# Patient Record
Sex: Male | Born: 1975 | Race: White | Hispanic: No | Marital: Married | Smoking: Former smoker
Health system: Southern US, Community
[De-identification: ages and names within clinical notes are randomized; demographics above are authoritative.]

## PROBLEM LIST (undated history)

## (undated) ENCOUNTER — Emergency Department (HOSPITAL_BASED_OUTPATIENT_CLINIC_OR_DEPARTMENT_OTHER): Discharge status: Absent without leave | Payer: No Typology Code available for payment source

## (undated) DIAGNOSIS — F32A Depression, unspecified: Secondary | ICD-10-CM

## (undated) DIAGNOSIS — N200 Calculus of kidney: Secondary | ICD-10-CM

## (undated) DIAGNOSIS — F329 Major depressive disorder, single episode, unspecified: Secondary | ICD-10-CM

## (undated) HISTORY — PX: APPENDECTOMY: SHX54

---

## 2008-03-26 DIAGNOSIS — M545 Low back pain, unspecified: Secondary | ICD-10-CM | POA: Insufficient documentation

## 2010-10-19 ENCOUNTER — Emergency Department (HOSPITAL_BASED_OUTPATIENT_CLINIC_OR_DEPARTMENT_OTHER)
Admission: EM | Admit: 2010-10-19 | Discharge: 2010-10-19 | Disposition: A | Discharge status: Home / Self Care | Payer: BC Managed Care – PPO | Attending: Emergency Medicine | Admitting: Emergency Medicine

## 2010-10-19 ENCOUNTER — Emergency Department (INDEPENDENT_AMBULATORY_CARE_PROVIDER_SITE_OTHER): Payer: BC Managed Care – PPO

## 2010-10-19 DIAGNOSIS — R110 Nausea: Secondary | ICD-10-CM | POA: Insufficient documentation

## 2010-10-19 DIAGNOSIS — N201 Calculus of ureter: Secondary | ICD-10-CM

## 2010-10-19 DIAGNOSIS — R109 Unspecified abdominal pain: Secondary | ICD-10-CM | POA: Insufficient documentation

## 2010-10-19 DIAGNOSIS — N1330 Unspecified hydronephrosis: Secondary | ICD-10-CM

## 2010-10-19 DIAGNOSIS — F411 Generalized anxiety disorder: Secondary | ICD-10-CM | POA: Insufficient documentation

## 2010-10-19 DIAGNOSIS — IMO0002 Reserved for concepts with insufficient information to code with codable children: Secondary | ICD-10-CM | POA: Insufficient documentation

## 2010-10-19 LAB — URINE MICROSCOPIC-ADD ON

## 2010-10-19 LAB — URINALYSIS, ROUTINE W REFLEX MICROSCOPIC
Nitrite: NEGATIVE
Protein, ur: NEGATIVE mg/dL
Urobilinogen, UA: 0.2 mg/dL (ref 0.0–1.0)

## 2010-10-20 LAB — URINE CULTURE
Colony Count: NO GROWTH
Culture  Setup Time: 201203051021
Culture: NO GROWTH

## 2010-11-17 ENCOUNTER — Emergency Department (HOSPITAL_BASED_OUTPATIENT_CLINIC_OR_DEPARTMENT_OTHER)
Admission: EM | Admit: 2010-11-17 | Discharge: 2010-11-17 | Disposition: A | Discharge status: Home / Self Care | Payer: BC Managed Care – PPO | Attending: Emergency Medicine | Admitting: Emergency Medicine

## 2010-11-17 DIAGNOSIS — F17200 Nicotine dependence, unspecified, uncomplicated: Secondary | ICD-10-CM | POA: Insufficient documentation

## 2010-11-17 DIAGNOSIS — N200 Calculus of kidney: Secondary | ICD-10-CM | POA: Insufficient documentation

## 2010-11-17 DIAGNOSIS — R109 Unspecified abdominal pain: Secondary | ICD-10-CM | POA: Insufficient documentation

## 2010-11-17 DIAGNOSIS — G8929 Other chronic pain: Secondary | ICD-10-CM | POA: Insufficient documentation

## 2010-11-17 LAB — DIFFERENTIAL
Eosinophils Absolute: 0.2 10*3/uL (ref 0.0–0.7)
Eosinophils Relative: 2 % (ref 0–5)
Lymphs Abs: 1.8 10*3/uL (ref 0.7–4.0)
Monocytes Relative: 9 % (ref 3–12)

## 2010-11-17 LAB — CBC
MCH: 30.3 pg (ref 26.0–34.0)
MCHC: 35.2 g/dL (ref 30.0–36.0)
MCV: 86.1 fL (ref 78.0–100.0)
Platelets: 444 10*3/uL — ABNORMAL HIGH (ref 150–400)
RDW: 12 % (ref 11.5–15.5)

## 2010-11-17 LAB — BASIC METABOLIC PANEL
BUN: 17 mg/dL (ref 6–23)
Calcium: 9.7 mg/dL (ref 8.4–10.5)
Creatinine, Ser: 0.8 mg/dL (ref 0.4–1.5)
GFR calc non Af Amer: >60 mL/min (ref >60)

## 2010-11-17 LAB — URINALYSIS, ROUTINE W REFLEX MICROSCOPIC
Glucose, UA: NEGATIVE mg/dL
Hgb urine dipstick: NEGATIVE
Ketones, ur: NEGATIVE mg/dL
Protein, ur: NEGATIVE mg/dL

## 2010-12-12 ENCOUNTER — Emergency Department (HOSPITAL_BASED_OUTPATIENT_CLINIC_OR_DEPARTMENT_OTHER)
Admission: EM | Admit: 2010-12-12 | Discharge: 2010-12-12 | Disposition: A | Discharge status: Home / Self Care | Payer: BC Managed Care – PPO | Attending: Emergency Medicine | Admitting: Emergency Medicine

## 2010-12-12 DIAGNOSIS — G8929 Other chronic pain: Secondary | ICD-10-CM | POA: Insufficient documentation

## 2010-12-12 DIAGNOSIS — N419 Inflammatory disease of prostate, unspecified: Secondary | ICD-10-CM | POA: Insufficient documentation

## 2010-12-12 DIAGNOSIS — R300 Dysuria: Secondary | ICD-10-CM | POA: Insufficient documentation

## 2010-12-12 DIAGNOSIS — F17200 Nicotine dependence, unspecified, uncomplicated: Secondary | ICD-10-CM | POA: Insufficient documentation

## 2010-12-12 LAB — URINALYSIS, ROUTINE W REFLEX MICROSCOPIC
Glucose, UA: NEGATIVE mg/dL
Ketones, ur: NEGATIVE mg/dL
Nitrite: NEGATIVE
Protein, ur: NEGATIVE mg/dL

## 2011-02-10 ENCOUNTER — Emergency Department (HOSPITAL_BASED_OUTPATIENT_CLINIC_OR_DEPARTMENT_OTHER)
Admission: EM | Admit: 2011-02-10 | Discharge: 2011-02-10 | Disposition: A | Discharge status: Home / Self Care | Payer: BC Managed Care – PPO | Attending: Emergency Medicine | Admitting: Emergency Medicine

## 2011-02-10 ENCOUNTER — Emergency Department (INDEPENDENT_AMBULATORY_CARE_PROVIDER_SITE_OTHER): Payer: BC Managed Care – PPO

## 2011-02-10 DIAGNOSIS — G8929 Other chronic pain: Secondary | ICD-10-CM | POA: Insufficient documentation

## 2011-02-10 DIAGNOSIS — R109 Unspecified abdominal pain: Secondary | ICD-10-CM | POA: Insufficient documentation

## 2011-02-10 DIAGNOSIS — N200 Calculus of kidney: Secondary | ICD-10-CM | POA: Insufficient documentation

## 2011-02-10 LAB — URINALYSIS, ROUTINE W REFLEX MICROSCOPIC
Bilirubin Urine: NEGATIVE
Ketones, ur: NEGATIVE mg/dL
Nitrite: NEGATIVE
pH: 7 (ref 5.0–8.0)

## 2011-12-03 ENCOUNTER — Encounter (HOSPITAL_BASED_OUTPATIENT_CLINIC_OR_DEPARTMENT_OTHER): Payer: Self-pay | Admitting: *Deleted

## 2011-12-03 ENCOUNTER — Ambulatory Visit: Payer: BC Managed Care – PPO

## 2011-12-03 ENCOUNTER — Ambulatory Visit (INDEPENDENT_AMBULATORY_CARE_PROVIDER_SITE_OTHER): Payer: BC Managed Care – PPO | Admitting: Physician Assistant

## 2011-12-03 ENCOUNTER — Emergency Department (HOSPITAL_BASED_OUTPATIENT_CLINIC_OR_DEPARTMENT_OTHER)
Admission: EM | Admit: 2011-12-03 | Discharge: 2011-12-03 | Disposition: A | Discharge status: Home / Self Care | Payer: BC Managed Care – PPO | Attending: Emergency Medicine | Admitting: Emergency Medicine

## 2011-12-03 VITALS — BP 117/72 | HR 62 | Temp 98.2°F | Resp 18 | Ht 70.0 in | Wt 166.0 lb

## 2011-12-03 DIAGNOSIS — M25529 Pain in unspecified elbow: Secondary | ICD-10-CM | POA: Insufficient documentation

## 2011-12-03 DIAGNOSIS — M7989 Other specified soft tissue disorders: Secondary | ICD-10-CM | POA: Insufficient documentation

## 2011-12-03 DIAGNOSIS — M7020 Olecranon bursitis, unspecified elbow: Secondary | ICD-10-CM

## 2011-12-03 HISTORY — DX: Depression, unspecified: F32.A

## 2011-12-03 HISTORY — DX: Major depressive disorder, single episode, unspecified: F32.9

## 2011-12-03 MED ORDER — MELOXICAM 7.5 MG PO TABS: 7.5000 mg | ORAL_TABLET | Freq: Two times a day (BID) | ORAL | Status: DC

## 2011-12-03 NOTE — ED Notes (Signed)
C/o right elbow pain and swelling, redness noted. Denies injury

## 2011-12-03 NOTE — Progress Notes (Signed)
  Subjective:    Patient ID: Bobby Bailey, male    DOB: [birth year], 36 y.o.   MRN: [id number]  HPI Mr. Farrington comes in tonight c/o right elbow swelling for 1 week.  NKI. Denies pain.  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  He denies any redness, streaking or fever.  This has never happened in the past.   Depression Insomnia   Review of Systems    as above  Objective:   Physical Exam  Musculoskeletal:       Right elbow: He exhibits swelling. He exhibits normal range of motion. no tenderness found.       Olecranon bursa swollen.  Non tender. No erythema   Xray attempted but patient had to leave abruptly just before xray completed.        Assessment & Plan:  Olecranon bursitis  Elbow sleeve. Mobic 7.5 bid Decrease pressure on elbow If no improvement consider ortho evaluation.  Pt made aware of infection precautions.

## 2011-12-03 NOTE — ED Notes (Signed)
Pt to nse desk-? When he was going to be seen by MD-advised that ED is extremely busy and that on coming EDP arrived at 11pm-seeing pts asap-states that he needs to leave due to he has been up since early am and began taking his ID braclet off-left ED-steady gait-NAD

## 2012-06-16 ENCOUNTER — Emergency Department (HOSPITAL_COMMUNITY): Payer: BC Managed Care – PPO

## 2012-06-16 ENCOUNTER — Observation Stay (HOSPITAL_COMMUNITY)
Admission: EM | Admit: 2012-06-16 | Discharge: 2012-06-17 | Disposition: A | Discharge status: Home / Self Care | Payer: BC Managed Care – PPO | Attending: Emergency Medicine | Admitting: Emergency Medicine

## 2012-06-16 ENCOUNTER — Encounter (HOSPITAL_COMMUNITY): Payer: Self-pay | Admitting: *Deleted

## 2012-06-16 DIAGNOSIS — R0602 Shortness of breath: Secondary | ICD-10-CM | POA: Insufficient documentation

## 2012-06-16 DIAGNOSIS — F17200 Nicotine dependence, unspecified, uncomplicated: Secondary | ICD-10-CM | POA: Insufficient documentation

## 2012-06-16 DIAGNOSIS — R079 Chest pain, unspecified: Principal | ICD-10-CM | POA: Insufficient documentation

## 2012-06-16 HISTORY — DX: Calculus of kidney: N20.0

## 2012-06-16 LAB — CBC WITH DIFFERENTIAL/PLATELET
Basophils Absolute: 0 10*3/uL (ref 0.0–0.1)
Eosinophils Relative: 1 % (ref 0–5)
Lymphocytes Relative: 27 % (ref 12–46)
MCV: 88 fL (ref 78.0–100.0)
Neutrophils Relative %: 63 % (ref 43–77)
Platelets: 466 10*3/uL — ABNORMAL HIGH (ref 150–400)
RDW: 12.1 % (ref 11.5–15.5)
WBC: 7.4 10*3/uL (ref 4.0–10.5)

## 2012-06-16 LAB — COMPREHENSIVE METABOLIC PANEL
Alkaline Phosphatase: 67 U/L (ref 39–117)
BUN: 13 mg/dL (ref 6–23)
CO2: 29 mEq/L (ref 19–32)
Chloride: 101 mEq/L (ref 96–112)
GFR calc Af Amer: >90 mL/min (ref >90)
GFR calc non Af Amer: >90 mL/min (ref >90)
Glucose, Bld: 88 mg/dL (ref 70–99)
Potassium: 4.8 mEq/L (ref 3.5–5.1)
Total Bilirubin: 0.3 mg/dL (ref 0.3–1.2)

## 2012-06-16 LAB — POCT I-STAT TROPONIN I
Troponin i, poc: 0 ng/mL (ref 0.00–0.08)
Troponin i, poc: 0 ng/mL (ref 0.00–0.08)

## 2012-06-16 LAB — RAPID URINE DRUG SCREEN, HOSP PERFORMED: Benzodiazepines: NOT DETECTED

## 2012-06-16 LAB — URINALYSIS, ROUTINE W REFLEX MICROSCOPIC
Ketones, ur: NEGATIVE mg/dL
Leukocytes, UA: NEGATIVE
Nitrite: NEGATIVE
Protein, ur: NEGATIVE mg/dL
pH: 8 (ref 5.0–8.0)

## 2012-06-16 MED ORDER — NITROGLYCERIN 0.4 MG SL SUBL
0.4000 mg | SUBLINGUAL_TABLET | SUBLINGUAL | Status: DC | PRN
  Administered 2012-06-17 (×2): 0.4 mg via SUBLINGUAL
  Filled 2012-06-16: qty 25

## 2012-06-16 MED ORDER — ASPIRIN 81 MG PO CHEW
324.0000 mg | CHEWABLE_TABLET | Freq: Once | ORAL | Status: AC
  Administered 2012-06-16: 324 mg via ORAL
  Filled 2012-06-16: qty 4

## 2012-06-16 MED ORDER — MORPHINE SULFATE 4 MG/ML IJ SOLN
4.0000 mg | Freq: Once | INTRAMUSCULAR | Status: AC
  Administered 2012-06-16: 4 mg via INTRAVENOUS
  Filled 2012-06-16: qty 1

## 2012-06-16 MED ORDER — MORPHINE SULFATE 4 MG/ML IJ SOLN
4.0000 mg | INTRAMUSCULAR | Status: DC | PRN
  Administered 2012-06-16: 4 mg via INTRAVENOUS
  Filled 2012-06-16: qty 1

## 2012-06-16 MED ORDER — ONDANSETRON HCL 4 MG/2ML IJ SOLN
4.0000 mg | INTRAMUSCULAR | Status: DC | PRN
  Administered 2012-06-16: 4 mg via INTRAVENOUS
  Filled 2012-06-16: qty 2

## 2012-06-16 MED ORDER — ZOLPIDEM TARTRATE 5 MG PO TABS
10.0000 mg | ORAL_TABLET | Freq: Every evening | ORAL | Status: DC | PRN
  Administered 2012-06-16: 10 mg via ORAL
  Filled 2012-06-16: qty 2

## 2012-06-16 NOTE — ED Notes (Signed)
Ambulatory to bathroom.

## 2012-06-16 NOTE — ED Provider Notes (Signed)
Toby Breithaupt is a 36 y.o. male on chest pain protocol pending coronary CT  Will be managed by pod B. (patient was sent from [facility]) . Report from Dr. Ranae Palms as follows: Acute onset of squeezing chest pain accompanied by shortness of breath. Patient is under stress at work, tearful past medical history significant for risk factors of 5 pack year history and father who had a MI in his 68s.  Patient seen and examined at the bedside. He reports moderate pain lasting for seconds in the left chest nonradiating. Patient states he is under an extraordinary amount of stress at work and he also has relationship issues with an impending divorce. Patient's lung sounds are clear to auscultation bilaterally his heart is regular rate and rhythm with no murmurs rubs and gallops abdominal exam is benign.  20-gauge to the left a.c. this will need to be replaced with an 18-gauge IV team will be called. Patient will be given 4 mg of morphine every 2 hours when necessary for pain and also given his nightly dose of Ambien.  Pt signed out to pod B attending Dr. Manus Gunning at shift change.   Care resumed in the AM signout from Dr. Hyacinth Meeker with no events or changes overnight.   Verbal report from Dr. Llana Aliment appreciated: there are no abnormalities noted on the coronary CT.    Pt verbalized understanding and agrees with care plan. Outpatient follow-up and return precautions given.     Wynetta Emery, PA-C 06/17/12 1105

## 2012-06-16 NOTE — ED Notes (Signed)
Report called-transfer to CDU

## 2012-06-16 NOTE — ED Notes (Signed)
[facility] called-transport at 1900

## 2012-06-16 NOTE — ED Notes (Signed)
Pt states "was just sitting @ my desk typing, no nausea, feel a little lightheaded, it feels like squeezing"

## 2012-06-16 NOTE — ED Provider Notes (Addendum)
History     CSN: [id number]  Arrival date & time 06/16/12  1221   First MD Initiated Contact with Patient 06/16/12 1240      Chief Complaint  Patient presents with  . Chest Pain    (Consider location/radiation/quality/duration/timing/severity/associated sxs/prior treatment) The history is provided by the patient.   Pt with history of depression presents with acute onset L chest pain while at work 45 min ago. Pt admits to increased life stressed and was upset. He states the pain was "squeezing" in nature and associated with mild SOB. Pain is now improved though dull prodding still present. No recent travel. + 0.5 pk/day smoking x 10 years. Family history sig for father with MI in his 44's.  Past Medical History  Diagnosis Date  . Depression   . Kidney stones     Past Surgical History  Procedure Date  . Appendectomy     No family history on file.  History  Substance Use Topics  . Smoking status: Current Every Day Smoker -- 0.5 packs/day    Types: Cigarettes  . Smokeless tobacco: Not on file  . Alcohol Use: No      Review of Systems  Constitutional: Negative for fever and chills.  Respiratory: Positive for shortness of breath. Negative for cough and wheezing.   Cardiovascular: Positive for chest pain. Negative for palpitations and leg swelling.  Gastrointestinal: Negative for nausea, vomiting, abdominal pain, diarrhea and constipation.  Musculoskeletal: Negative for back pain.  Skin: Negative for rash and wound.  Neurological: Negative for dizziness, weakness, light-headedness, numbness and headaches.  Psychiatric/Behavioral: The patient is nervous/anxious.   All other systems reviewed and are negative.    Allergies  Review of patient's allergies indicates no known allergies.  Home Medications   Current Outpatient Rx  Name Route Sig Dispense Refill  . BUPRENORPHINE HCL-NALOXONE HCL 8-2 MG SL SUBL Sublingual Place 2 tablets under the tongue daily.    Marland Kitchen  VILAZODONE HCL 40 MG PO TABS Oral Take 40 mg by mouth daily.    Marland Kitchen ZOLPIDEM TARTRATE 10 MG PO TABS Oral Take 10 mg by mouth at bedtime as needed. For sleep.      BP 123/76  Pulse 72  Temp 98 F (36.7 C) (Oral)  Resp 21  Wt 170 lb (77.111 kg)  SpO2 100%  Physical Exam  Nursing note and vitals reviewed. Constitutional: He is oriented to person, place, and time. He appears well-developed and well-nourished. No distress.  HENT:  Head: Normocephalic and atraumatic.  Mouth/Throat: Oropharynx is clear and moist.  Eyes: EOM are normal. Pupils are equal, round, and reactive to light.  Neck: Normal range of motion. Neck supple.  Cardiovascular: Normal rate and regular rhythm.   Pulmonary/Chest: Effort normal and breath sounds normal. No respiratory distress. He has no wheezes. He has no rales. He exhibits no tenderness.  Abdominal: Soft. Bowel sounds are normal. He exhibits no distension and no mass. There is no tenderness. There is no rebound and no guarding.  Musculoskeletal: Normal range of motion. He exhibits no edema and no tenderness.       No calf swelling or edema  Neurological: He is alert and oriented to person, place, and time.  Skin: Skin is warm and dry. No rash noted. No erythema.  Psychiatric:       Anxious and tearful    ED Course  Procedures (including critical care time)  Labs Reviewed  CBC WITH DIFFERENTIAL - Abnormal; Notable for the following:  Platelets 466 (*)     All other components within normal limits  COMPREHENSIVE METABOLIC PANEL  POCT I-STAT TROPONIN I  URINALYSIS, ROUTINE W REFLEX MICROSCOPIC  URINE RAPID DRUG SCREEN (HOSP PERFORMED)   Dg Chest Port 1 View  06/16/2012  *RADIOLOGY REPORT*  Clinical Data: 36 year old male chest pain and shortness of breath.  PORTABLE CHEST - 1 VIEW  Comparison: CT abdomen 10/19/2010.  Findings: Lung volumes within normal limits. Normal cardiac size and mediastinal contours.  Visualized tracheal air column is within  normal limits.  Allowing for portable technique, the lungs are clear.  No pneumothorax or effusion.  IMPRESSION: Negative, no acute cardiopulmonary abnormality.   Original Report Authenticated By: Erskine Speed, M.D.      1. Chest pain       Date: 06/16/2012  Rate: 35  Rhythm: normal sinus rhythm  QRS Axis: normal  Intervals: normal  ST/T Wave abnormalities: nonspecific T wave changes  Conduction Disutrbances:none  Narrative Interpretation:   Old EKG Reviewed: none available   MDM   Re-eval of pt. He is now pain free. EKG and trop without concerning findings. Given risk factors will tranfer to [facility] CDU for chest pain r/o. Discussed with Dr Radford Pax and Joni Reining who are expecting pt.        Loren Racer, MD 06/16/12 1426  Loren Racer, MD 07/12/12 [phone]

## 2012-06-17 ENCOUNTER — Observation Stay (HOSPITAL_COMMUNITY): Payer: BC Managed Care – PPO

## 2012-06-17 MED ORDER — IOHEXOL 350 MG/ML SOLN
80.0000 mL | Freq: Once | INTRAVENOUS | Status: AC | PRN
  Administered 2012-06-17: 80 mL via INTRAVENOUS

## 2012-06-17 MED ORDER — SODIUM CHLORIDE 0.9 % IV SOLN
Freq: Once | INTRAVENOUS | Status: AC
  Administered 2012-06-17: 10:00:00 via INTRAVENOUS

## 2012-06-17 MED ORDER — METOPROLOL TARTRATE 1 MG/ML IV SOLN
5.0000 mg | Freq: Once | INTRAVENOUS | Status: AC
  Administered 2012-06-17: 5 mg via INTRAVENOUS
  Filled 2012-06-17: qty 10

## 2012-06-17 MED ORDER — ACETAMINOPHEN 325 MG PO TABS
975.0000 mg | ORAL_TABLET | Freq: Once | ORAL | Status: AC
  Administered 2012-06-17: 975 mg via ORAL
  Filled 2012-06-17: qty 3

## 2012-06-17 MED ORDER — SODIUM CHLORIDE 0.9 % IV SOLN
Freq: Once | INTRAVENOUS | Status: AC
  Administered 2012-06-17: 11:00:00 via INTRAVENOUS

## 2012-06-17 NOTE — ED Notes (Addendum)
Metoprolol not given pt heart rate 40's to 50's

## 2012-06-17 NOTE — ED Notes (Addendum)
Wt  172 Ibs and  65ft tall and BMI 23.3

## 2012-06-18 ENCOUNTER — Encounter (HOSPITAL_BASED_OUTPATIENT_CLINIC_OR_DEPARTMENT_OTHER): Payer: Self-pay | Admitting: *Deleted

## 2012-06-18 ENCOUNTER — Emergency Department (HOSPITAL_BASED_OUTPATIENT_CLINIC_OR_DEPARTMENT_OTHER)
Admission: EM | Admit: 2012-06-18 | Discharge: 2012-06-19 | Disposition: A | Discharge status: Home / Self Care | Payer: BC Managed Care – PPO | Attending: Emergency Medicine | Admitting: Emergency Medicine

## 2012-06-18 DIAGNOSIS — F3289 Other specified depressive episodes: Secondary | ICD-10-CM | POA: Insufficient documentation

## 2012-06-18 DIAGNOSIS — F32A Depression, unspecified: Secondary | ICD-10-CM

## 2012-06-18 DIAGNOSIS — Z79899 Other long term (current) drug therapy: Secondary | ICD-10-CM | POA: Insufficient documentation

## 2012-06-18 DIAGNOSIS — Z87442 Personal history of urinary calculi: Secondary | ICD-10-CM | POA: Insufficient documentation

## 2012-06-18 DIAGNOSIS — F329 Major depressive disorder, single episode, unspecified: Secondary | ICD-10-CM

## 2012-06-18 DIAGNOSIS — F17200 Nicotine dependence, unspecified, uncomplicated: Secondary | ICD-10-CM | POA: Insufficient documentation

## 2012-06-18 DIAGNOSIS — Z9089 Acquired absence of other organs: Secondary | ICD-10-CM | POA: Insufficient documentation

## 2012-06-18 NOTE — ED Provider Notes (Addendum)
History     CSN: [id number]  Arrival date & time 06/18/12  2317   First MD Initiated Contact with Patient 06/18/12 2342      Chief Complaint  Patient presents with  . Depression    (Consider location/radiation/quality/duration/timing/severity/associated sxs/prior treatment) HPI Level 5 Caveat: depression, unwillingness to communicate. This is a 36 year old male with a long-standing history of depression. He sees a psychiatrist has him on Viibryd. He was admitted to [facility] 4 days ago for chest pain; he had negative serial troponins and negative CT angio chest. He denies chest pain at the present time.  He is here this evening because he got in a fight with his wife. He states his wife said "why should I feel sorry for you because you were in the hospital". He became very despondent and walk about 5 miles here from his office. He denies suicidal ideation or homicidal ideation but will not make eye contact and it is difficult to engage in conversation. He denies drug or alcohol use although it is noted that he is on Suboxone. [He later admitted to having a "problem with pain medication" for which he takes Suboxone "as needed.]  Past Medical History  Diagnosis Date  . Depression   . Kidney stones     Past Surgical History  Procedure Date  . Appendectomy     No family history on file.  History  Substance Use Topics  . Smoking status: Current Every Day Smoker -- 0.5 packs/day    Types: Cigarettes  . Smokeless tobacco: Not on file  . Alcohol Use: No      Review of Systems  Unable to perform ROS   Allergies  Review of patient's allergies indicates no known allergies.  Home Medications   Current Outpatient Rx  Name  Route  Sig  Dispense  Refill  . BUPRENORPHINE HCL-NALOXONE HCL 8-2 MG SL SUBL   Sublingual   Place 2 tablets under the tongue daily.         Marland Kitchen VILAZODONE HCL 40 MG PO TABS   Oral   Take 40 mg by mouth daily.         Marland Kitchen ZOLPIDEM  TARTRATE 10 MG PO TABS   Oral   Take 10 mg by mouth at bedtime as needed. For sleep.           BP 126/73  Pulse 67  Temp 98 F (36.7 C)  Resp 20  Ht 6' (1.829 m)  Wt 172 lb (78.019 kg)  BMI 23.33 kg/m2  SpO2 98%  Physical Exam General: Well-developed, well-nourished male in no acute distress; appearance consistent with age of record HENT: normocephalic, atraumatic Eyes: Normal appearance Neck: supple Heart: regular rate and rhythm Lungs: Normal respiratory effort and excursion Abdomen: soft; nondistended Extremities: No deformity; full range of motion Neurologic: Awake, alert and oriented; motor function intact in all extremities and symmetric; no facial droop Skin: Warm and dry Psychiatric: Depressed; little eye contact; poverty of speech    ED Course  Procedures (including critical care time)     MDM   Nursing notes and vitals signs, including pulse oximetry, reviewed.  Summary of this visit's results, reviewed by myself:  Labs:  Results for orders placed during the hospital encounter of 06/18/12  BASIC METABOLIC PANEL      Component Value Range   Sodium 137  135 - 145 mEq/L   Potassium 4.0  3.5 - 5.1 mEq/L   Chloride 99  96 - 112 mEq/L  CO2 29  19 - 32 mEq/L   Glucose, Bld 93  70 - 99 mg/dL   BUN 12  6 - 23 mg/dL   Creatinine, Ser 0.98  0.50 - 1.35 mg/dL   Calcium 9.7  8.4 - 11.9 mg/dL   GFR calc non Af Amer >90  >90 mL/min   GFR calc Af Amer >90  >90 mL/min  URINE RAPID DRUG SCREEN (HOSP PERFORMED)      Component Value Range   Opiates POSITIVE (*) NONE DETECTED   Cocaine NONE DETECTED  NONE DETECTED   Benzodiazepines NONE DETECTED  NONE DETECTED   Amphetamines NONE DETECTED  NONE DETECTED   Tetrahydrocannabinol NONE DETECTED  NONE DETECTED   Barbiturates NONE DETECTED  NONE DETECTED  ETHANOL      Component Value Range   Alcohol, Ethyl (B) <11  0 - 11 mg/dL  CBC WITH DIFFERENTIAL      Component Value Range   WBC 8.5  4.0 - 10.5 K/uL   RBC  4.90  4.22 - 5.81 MIL/uL   Hemoglobin 14.9  13.0 - 17.0 g/dL   HCT 14.7  82.9 - 56.2 %   MCV 87.3  78.0 - 100.0 fL   MCH 30.4  26.0 - 34.0 pg   MCHC 34.8  30.0 - 36.0 g/dL   RDW 13.0  86.5 - 78.4 %   Platelets 420 (*) 150 - 400 K/uL   Neutrophils Relative 61  43 - 77 %   Neutro Abs 5.2  1.7 - 7.7 K/uL   Lymphocytes Relative 29  12 - 46 %   Lymphs Abs 2.5  0.7 - 4.0 K/uL   Monocytes Relative 8  3 - 12 %   Monocytes Absolute 0.7  0.1 - 1.0 K/uL   Eosinophils Relative 2  0 - 5 %   Eosinophils Absolute 0.1  0.0 - 0.7 K/uL   Basophils Relative 1  0 - 1 %   Basophils Absolute 0.0  0.0 - 0.1 K/uL   2:29 AM Awaiting Mobile Crisis.  5:38 AM Patient assessed by Mobile Crisis. They will refer him to [facility] (see Mobile Crisis documentation).          Hanley Seamen, MD 06/19/12 6962  Hanley Seamen, MD 06/19/12 9528  Hanley Seamen, MD 06/19/12 4132

## 2012-06-18 NOTE — ED Notes (Signed)
MD at bedside. 

## 2012-06-18 NOTE — ED Notes (Signed)
Pt. Presents to triage crying. States he was treated/admitted this past week at [facility] for Chest Pain. States he was d/c on Saturday and started feeling depressed. States hx of depression. Worse today because he got into a fight with his wife. States he doesn't know if he will hurt himself or not. Denies a plan to hurt himself at present.

## 2012-06-19 LAB — CBC WITH DIFFERENTIAL/PLATELET
Eosinophils Absolute: 0.1 10*3/uL (ref 0.0–0.7)
Eosinophils Relative: 2 % (ref 0–5)
Hemoglobin: 14.9 g/dL (ref 13.0–17.0)
Lymphocytes Relative: 29 % (ref 12–46)
Lymphs Abs: 2.5 10*3/uL (ref 0.7–4.0)
MCH: 30.4 pg (ref 26.0–34.0)
MCV: 87.3 fL (ref 78.0–100.0)
Monocytes Relative: 8 % (ref 3–12)
Platelets: 420 10*3/uL — ABNORMAL HIGH (ref 150–400)
RBC: 4.9 MIL/uL (ref 4.22–5.81)
WBC: 8.5 10*3/uL (ref 4.0–10.5)

## 2012-06-19 LAB — BASIC METABOLIC PANEL
Calcium: 9.7 mg/dL (ref 8.4–10.5)
Creatinine, Ser: 1 mg/dL (ref 0.50–1.35)
GFR calc Af Amer: >90 mL/min (ref >90)
GFR calc non Af Amer: >90 mL/min (ref >90)
Sodium: 137 mEq/L (ref 135–145)

## 2012-06-19 LAB — RAPID URINE DRUG SCREEN, HOSP PERFORMED
Cocaine: NOT DETECTED
Opiates: POSITIVE — AB
Tetrahydrocannabinol: NOT DETECTED

## 2012-06-19 MED ORDER — LORAZEPAM 2 MG/ML IJ SOLN: 1.0000 mg | Freq: Once | INTRAMUSCULAR | Status: DC

## 2012-06-19 MED ORDER — LORAZEPAM 1 MG PO TABS
ORAL_TABLET | ORAL | Status: AC
  Administered 2012-06-19: 1 mg via ORAL
  Filled 2012-06-19: qty 1

## 2012-06-19 MED ORDER — LORAZEPAM 1 MG PO TABS
1.0000 mg | ORAL_TABLET | Freq: Once | ORAL | Status: AC
  Administered 2012-06-19: 1 mg via ORAL

## 2012-06-19 MED ORDER — LORAZEPAM 1 MG PO TABS: 1.0000 mg | ORAL_TABLET | Freq: Once | ORAL | Status: DC

## 2012-06-19 NOTE — ED Notes (Signed)
Offered pt a taxi ride home. States he has a safe place to go and will be able to pay for his ride. Denies any suicide thoughts or thoughts of hurting others at d/c. Pt. Was given information at d/c from mobile crisis regarding f/u. Voiced understanding of these instructions as well.

## 2012-06-19 NOTE — ED Notes (Addendum)
[facility] called, spoke with Felicia.

## 2012-06-19 NOTE — ED Provider Notes (Signed)
Pt followed by another edp  Suzi Roots, MD 06/19/12 [phone]

## 2012-06-19 NOTE — ED Notes (Signed)
[facility] Crisis Counselor here to evaluate patient.

## 2012-06-19 NOTE — ED Notes (Signed)
Asked pt. Again if he planned on hurting himself. States he does not plan to hurt himself. Called MD to speak with pt.

## 2012-09-30 ENCOUNTER — Other Ambulatory Visit: Payer: Self-pay

## 2013-05-08 ENCOUNTER — Emergency Department (HOSPITAL_BASED_OUTPATIENT_CLINIC_OR_DEPARTMENT_OTHER)
Admission: EM | Admit: 2013-05-08 | Discharge: 2013-05-08 | Disposition: A | Discharge status: Home / Self Care | Payer: BC Managed Care – PPO | Attending: Emergency Medicine | Admitting: Emergency Medicine

## 2013-05-08 ENCOUNTER — Emergency Department (HOSPITAL_BASED_OUTPATIENT_CLINIC_OR_DEPARTMENT_OTHER): Payer: BC Managed Care – PPO

## 2013-05-08 ENCOUNTER — Encounter (HOSPITAL_BASED_OUTPATIENT_CLINIC_OR_DEPARTMENT_OTHER): Payer: Self-pay | Admitting: *Deleted

## 2013-05-08 DIAGNOSIS — R0789 Other chest pain: Secondary | ICD-10-CM | POA: Insufficient documentation

## 2013-05-08 DIAGNOSIS — F17200 Nicotine dependence, unspecified, uncomplicated: Secondary | ICD-10-CM | POA: Insufficient documentation

## 2013-05-08 DIAGNOSIS — Z79899 Other long term (current) drug therapy: Secondary | ICD-10-CM | POA: Insufficient documentation

## 2013-05-08 DIAGNOSIS — F3289 Other specified depressive episodes: Secondary | ICD-10-CM | POA: Insufficient documentation

## 2013-05-08 DIAGNOSIS — Z87442 Personal history of urinary calculi: Secondary | ICD-10-CM | POA: Insufficient documentation

## 2013-05-08 DIAGNOSIS — R0602 Shortness of breath: Secondary | ICD-10-CM | POA: Insufficient documentation

## 2013-05-08 DIAGNOSIS — F329 Major depressive disorder, single episode, unspecified: Secondary | ICD-10-CM | POA: Insufficient documentation

## 2013-05-08 LAB — CBC WITH DIFFERENTIAL/PLATELET
Eosinophils Absolute: 0.1 10*3/uL (ref 0.0–0.7)
Eosinophils Relative: 1 % (ref 0–5)
HCT: 43.4 % (ref 39.0–52.0)
Lymphocytes Relative: 18 % (ref 12–46)
Lymphs Abs: 2 10*3/uL (ref 0.7–4.0)
MCH: 30.9 pg (ref 26.0–34.0)
MCV: 88.8 fL (ref 78.0–100.0)
Monocytes Absolute: 1.1 10*3/uL — ABNORMAL HIGH (ref 0.1–1.0)
RBC: 4.89 MIL/uL (ref 4.22–5.81)
RDW: 12 % (ref 11.5–15.5)
WBC: 11.1 10*3/uL — ABNORMAL HIGH (ref 4.0–10.5)

## 2013-05-08 LAB — TROPONIN I: Troponin I: <0.3 ng/mL (ref <0.30)

## 2013-05-08 LAB — COMPREHENSIVE METABOLIC PANEL
BUN: 15 mg/dL (ref 6–23)
CO2: 26 mEq/L (ref 19–32)
Calcium: 10 mg/dL (ref 8.4–10.5)
Creatinine, Ser: 0.8 mg/dL (ref 0.50–1.35)
GFR calc Af Amer: >90 mL/min (ref >90)
GFR calc non Af Amer: >90 mL/min (ref >90)
Glucose, Bld: 99 mg/dL (ref 70–99)
Total Protein: 7 g/dL (ref 6.0–8.3)

## 2013-05-08 LAB — D-DIMER, QUANTITATIVE: D-Dimer, Quant: <0.27 ug/mL-FEU (ref 0.00–0.48)

## 2013-05-08 MED ORDER — LORAZEPAM 1 MG PO TABS
1.0000 mg | ORAL_TABLET | Freq: Once | ORAL | Status: AC
  Administered 2013-05-08: 1 mg via ORAL
  Filled 2013-05-08: qty 1

## 2013-05-08 MED ORDER — KETOROLAC TROMETHAMINE 60 MG/2ML IM SOLN
60.0000 mg | Freq: Once | INTRAMUSCULAR | Status: AC
  Administered 2013-05-08: 60 mg via INTRAMUSCULAR
  Filled 2013-05-08: qty 2

## 2013-05-08 MED ORDER — IBUPROFEN 800 MG PO TABS: 800.0000 mg | ORAL_TABLET | Freq: Three times a day (TID) | ORAL | Status: DC

## 2013-05-08 NOTE — ED Notes (Signed)
Pt amb to nurse's desk, states "doc said all my tests were ok and he is going to get me out of here...:" pt states "I took my iv out for you." shows this rn his right a/c, to which he is holding a paper towel and pressure. Paper towel removed to reveal slight redness to site, no active bleeding. Informed pt that md is in process of printing his written discharge instructions. Pt verbalizes understanding and walks back to room.

## 2013-05-08 NOTE — ED Provider Notes (Signed)
CSN: [id number]     Arrival date & time 05/08/13  4098 History   First MD Initiated Contact with Patient 05/08/13 [phone]     Chief Complaint  Patient presents with  . Chest Pain  . Shortness of Breath   (Consider location/radiation/quality/duration/timing/severity/associated sxs/prior Treatment) HPI Comments: Patient presents with left-sided chest pain onset around midnight. The pain is sharp and stabbing in the left side of his chest lasting for 10-30 seconds at a time. The results for about a minute and then recurs. Pain does not last longer than 30 seconds. It is worse with arm movement and movement of his torso. He denies any cough or fever. He denies any cardiac history. He had a workup for similar pain in November last year which was unremarkable. He denies any leg pain or swelling. Denies any recent travel.  The history is provided by the patient and the EMS personnel.    Past Medical History  Diagnosis Date  . Depression   . Kidney stones    Past Surgical History  Procedure Laterality Date  . Appendectomy     History reviewed. No pertinent family history. History  Substance Use Topics  . Smoking status: Current Every Day Smoker -- 0.50 packs/day    Types: Cigarettes  . Smokeless tobacco: Not on file  . Alcohol Use: No    Review of Systems  Constitutional: Negative for fever, activity change and appetite change.  HENT: Negative for congestion and rhinorrhea.   Respiratory: Positive for shortness of breath. Negative for cough.   Cardiovascular: Positive for chest pain. Negative for leg swelling.  Gastrointestinal: Negative for nausea, vomiting and abdominal pain.  Genitourinary: Negative for dysuria.  Musculoskeletal: Negative for back pain.  Skin: Negative for rash.  Neurological: Negative for dizziness, weakness and headaches.  A complete 10 system review of systems was obtained and all systems are negative except as noted in the HPI and PMH.    Allergies  Review  of patient's allergies indicates no known allergies.  Home Medications   Current Outpatient Rx  Name  Route  Sig  Dispense  Refill  . buprenorphine-naloxone (SUBOXONE) 8-2 MG SUBL   Sublingual   Place 2 tablets under the tongue daily.         Marland Kitchen ibuprofen (ADVIL,MOTRIN) 800 MG tablet   Oral   Take 1 tablet (800 mg total) by mouth 3 (three) times daily.   21 tablet   0   . Vilazodone HCl (VIIBRYD) 40 MG TABS   Oral   Take 40 mg by mouth daily.         Marland Kitchen zolpidem (AMBIEN) 10 MG tablet   Oral   Take 10 mg by mouth at bedtime as needed. For sleep.          BP 126/79  Pulse 82  Temp(Src) 98.3 F (36.8 C) (Oral)  Resp 18  SpO2 100% Physical Exam  Constitutional: He is oriented to person, place, and time. He appears well-developed and well-nourished. No distress.  Anxious  HENT:  Head: Normocephalic.  Mouth/Throat: Oropharynx is clear and moist. No oropharyngeal exudate.  Eyes: Conjunctivae and EOM are normal. Pupils are equal, round, and reactive to light.  Neck: Normal range of motion. Neck supple.  Cardiovascular: Normal rate, regular rhythm and normal heart sounds.   No murmur heard. Pulmonary/Chest: Effort normal and breath sounds normal. No respiratory distress. He exhibits tenderness.  Reproducible left lateral chest wall tenderness. Erythema at sites of cardiac leads placement.  Abdominal: Soft.  There is no tenderness. There is no rebound and no guarding.  Musculoskeletal: Normal range of motion. He exhibits no edema and no tenderness.  Neurological: He is alert and oriented to person, place, and time. No cranial nerve deficit. He exhibits normal muscle tone. Coordination normal.  Skin: Skin is warm.    ED Course  Procedures (including critical care time) Labs Review Labs Reviewed  CBC WITH DIFFERENTIAL - Abnormal; Notable for the following:    WBC 11.1 (*)    Platelets 423 (*)    Neutro Abs 7.8 (*)    Monocytes Absolute 1.1 (*)    All other components  within normal limits  COMPREHENSIVE METABOLIC PANEL  TROPONIN I  D-DIMER, QUANTITATIVE   Imaging Review Dg Chest 2 View  05/08/2013   CLINICAL DATA:  Left-sided sharp chest pain, shortness of breath  EXAM: CHEST  2 VIEW  COMPARISON:  06/16/2012  FINDINGS: Cardiomediastinal silhouette is stable. No acute infiltrate or pleural effusion. No pulmonary edema. Bony thorax is unremarkable. Mild elevation of the right hemidiaphragm again noted.  IMPRESSION: No active cardiopulmonary disease.  No change.   Electronically Signed   By: Natasha Mead   On: 05/08/2013 10:04    MDM   1. Atypical chest pain    Atypical chest pain since last night lasting for a few seconds at a time. Worse with palpation and worse with arm movement.  Low suspicion for ACS or PE. EKG is not ischemic change. Records reviewed shows patient had CT coronary study in November 2013 showed no disease.  Chest x-ray negative. Troponin and d-dimer negative. Pain somewhat improved with Toradol. Suspect musculoskeletal chest pain or pleurisy as source of pain. Anxiety also likely contributing.    Date: 05/08/2013  Rate: 66  Rhythm: normal sinus rhythm  QRS Axis: normal  Intervals: normal  ST/T Wave abnormalities: normal  Conduction Disutrbances:none  Narrative Interpretation:   Old EKG Reviewed: unchanged     Glynn Octave, MD 05/08/13 [phone]

## 2013-05-08 NOTE — ED Notes (Signed)
Pt to room 1 by ems, reports "sharp" chest pains to the left side of his chest made worse by positioning and movements. Pt also reports sob. Pt states he was admitted inpatient to [place] about 6 months ago for the same with no findings.

## 2013-05-08 NOTE — ED Notes (Signed)
Pt assited to call for ride home.

## 2013-06-21 ENCOUNTER — Other Ambulatory Visit: Payer: Self-pay

## 2014-02-07 ENCOUNTER — Ambulatory Visit (INDEPENDENT_AMBULATORY_CARE_PROVIDER_SITE_OTHER): Payer: BC Managed Care – PPO | Admitting: Physician Assistant

## 2014-02-07 VITALS — BP 110/74 | HR 74 | Temp 97.9°F | Resp 18 | Ht 70.5 in | Wt 166.6 lb

## 2014-02-07 DIAGNOSIS — M545 Low back pain, unspecified: Secondary | ICD-10-CM

## 2014-02-07 DIAGNOSIS — G4700 Insomnia, unspecified: Secondary | ICD-10-CM | POA: Insufficient documentation

## 2014-02-07 DIAGNOSIS — Z87442 Personal history of urinary calculi: Secondary | ICD-10-CM | POA: Insufficient documentation

## 2014-02-07 DIAGNOSIS — Z113 Encounter for screening for infections with a predominantly sexual mode of transmission: Secondary | ICD-10-CM

## 2014-02-07 DIAGNOSIS — N410 Acute prostatitis: Secondary | ICD-10-CM

## 2014-02-07 DIAGNOSIS — R300 Dysuria: Secondary | ICD-10-CM

## 2014-02-07 LAB — POCT URINALYSIS DIPSTICK
BILIRUBIN UA: NEGATIVE
Blood, UA: NEGATIVE
Glucose, UA: NEGATIVE
KETONES UA: NEGATIVE
LEUKOCYTES UA: NEGATIVE
NITRITE UA: NEGATIVE
Protein, UA: NEGATIVE
Spec Grav, UA: 1.02
Urobilinogen, UA: 0.2
pH, UA: 7

## 2014-02-07 LAB — POCT UA - MICROSCOPIC ONLY
Casts, Ur, LPF, POC: NEGATIVE
Crystals, Ur, HPF, POC: NEGATIVE
MUCUS UA: NEGATIVE
RBC, urine, microscopic: NEGATIVE
WBC, UR, HPF, POC: NEGATIVE
YEAST UA: NEGATIVE

## 2014-02-07 MED ORDER — SULFAMETHOXAZOLE-TMP DS 800-160 MG PO TABS: 1.0000 | ORAL_TABLET | Freq: Two times a day (BID) | ORAL | Status: DC

## 2014-02-07 NOTE — Progress Notes (Signed)
Subjective:    Patient ID: Bobby Bailey, male    DOB: [birth year], 38 y.o.   MRN: [id number]  HPI  Pt presents to clinic with right sided low back pain for the last several days without change.  He states nothing seems to make it better or worse and it has not changed in its location.  Last pm it did slightly go over to the left side but no pain radiation today.  He is having no weakness.  He has had NKI.  He is also having trouble getting his stream started but once the urine starts it is normal. He has noticed that when he sits his urine stream starts much faster.  The urine stream has normal force but he does have some discomfort as the urine passed through the urethra.  He is having no pain into his scrotum.  He has had kidney stones before and this feels different.  He has had muscle spasms and it feels different from that.  He has tried motrin and it does not seem to help the pain much.  He has had no F/C or abd symptoms.  He is newly divorced and thinks that his risk of STD is low.    H/o kidney stones and prostatitis -  Review of Systems  Constitutional: Negative for fever and chills.  Gastrointestinal: Negative.  Negative for nausea, vomiting, diarrhea and constipation.  Genitourinary: Positive for difficulty urinating (trouble getting stream started but once started no problems). Negative for hematuria, flank pain, discharge and scrotal swelling.  Musculoskeletal: Positive for back pain (right - low back). Negative for gait problem.   Results for orders placed in visit on 02/07/14  POCT URINALYSIS DIPSTICK      Result Value Ref Range   Color, UA YELLOW     Clarity, UA CLEAR     Glucose, UA NEG     Bilirubin, UA NEG     Ketones, UA NEG     Spec Grav, UA 1.020     Blood, UA NEG     pH, UA 7.0     Protein, UA NEG     Urobilinogen, UA 0.2     Nitrite, UA NEG     Leukocytes, UA Negative    POCT UA - MICROSCOPIC ONLY      Result Value Ref Range   WBC, Ur, HPF, POC NEG     RBC, urine, microscopic NEG     Bacteria, U Microscopic 1+     Mucus, UA NEG     Epithelial cells, urine per micros 0-3     Crystals, Ur, HPF, POC NEG     Casts, Ur, LPF, POC NEG     Yeast, UA NEG         Objective:   Physical Exam  Vitals reviewed. Constitutional: He is oriented to person, place, and time. He appears well-developed and well-nourished.  HENT:  Head: Normocephalic and atraumatic.  Right Ear: External ear normal.  Left Ear: External ear normal.  Cardiovascular: Normal rate, regular rhythm and normal heart sounds.   No murmur heard. Pulmonary/Chest: Effort normal and breath sounds normal. He has no wheezes.  Abdominal: Hernia confirmed negative in the right inguinal area and confirmed negative in the left inguinal area.  Genitourinary: Right testis shows tenderness (epididymis is larged on the right side but no TTP). Circumcised.  Musculoskeletal: Normal range of motion.       Lumbar back: He exhibits no tenderness, no bony tenderness, no swelling and  no spasm.  Neurological: He is alert and oriented to person, place, and time. He has normal strength and normal reflexes. No cranial nerve deficit or sensory deficit.  Reflex Scores:      Patellar reflexes are 2+ on the right side and 2+ on the left side.      Achilles reflexes are 2+ on the right side and 2+ on the left side. Skin: Skin is warm and dry.  Psychiatric: He has a normal mood and affect. His behavior is normal. Judgment and thought content normal.          Assessment & Plan:  Insomnia  Dysuria - Plan: GC/Chlamydia Probe Amp, Urine culture  Right-sided low back pain without sciatica - Plan: POCT urinalysis dipstick, POCT UA - Microscopic Only  Screening for STD (sexually transmitted disease) - Plan: Hepatitis B surface antigen, Hepatitis C antibody, HIV antibody, RPR, HSV 2 antibody, IgG  Acute prostatitis - Plan: sulfamethoxazole-trimethoprim (BACTRIM DS) 800-160 MG per tablet  I am unsure with  his current symptoms, exam and lab result the exact cause of his pain.  He does not feel that it is muscular in origin.  Due to the urine symptoms will cover for prostatitis while waiting on the labs.  Benny LennertSarah Weber PA-C  [facility] 02/07/2014 1:41 PM

## 2014-02-08 LAB — GC/CHLAMYDIA PROBE AMP
CT Probe RNA: NEGATIVE
GC PROBE AMP APTIMA: NEGATIVE

## 2014-02-08 LAB — HIV ANTIBODY (ROUTINE TESTING W REFLEX): HIV: NONREACTIVE

## 2014-02-08 LAB — HEPATITIS C ANTIBODY: HCV AB: NEGATIVE

## 2014-02-08 LAB — RPR

## 2014-02-08 LAB — URINE CULTURE
COLONY COUNT: NO GROWTH
ORGANISM ID, BACTERIA: NO GROWTH

## 2014-02-08 LAB — HEPATITIS B SURFACE ANTIGEN: Hepatitis B Surface Ag: NEGATIVE

## 2014-02-11 LAB — HSV 2 ANTIBODY, IGG: HSV 2 Glycoprotein G Ab, IgG: <0.1 IV

## 2015-01-01 ENCOUNTER — Emergency Department (HOSPITAL_BASED_OUTPATIENT_CLINIC_OR_DEPARTMENT_OTHER)
Admission: EM | Admit: 2015-01-01 | Discharge: 2015-01-01 | Disposition: A | Discharge status: Home / Self Care | Payer: BLUE CROSS/BLUE SHIELD | Attending: Emergency Medicine | Admitting: Emergency Medicine

## 2015-01-01 ENCOUNTER — Emergency Department (HOSPITAL_BASED_OUTPATIENT_CLINIC_OR_DEPARTMENT_OTHER): Payer: BLUE CROSS/BLUE SHIELD

## 2015-01-01 ENCOUNTER — Encounter (HOSPITAL_BASED_OUTPATIENT_CLINIC_OR_DEPARTMENT_OTHER): Payer: Self-pay | Admitting: *Deleted

## 2015-01-01 DIAGNOSIS — R6883 Chills (without fever): Secondary | ICD-10-CM | POA: Diagnosis not present

## 2015-01-01 DIAGNOSIS — Z87891 Personal history of nicotine dependence: Secondary | ICD-10-CM | POA: Insufficient documentation

## 2015-01-01 DIAGNOSIS — Z87442 Personal history of urinary calculi: Secondary | ICD-10-CM | POA: Diagnosis not present

## 2015-01-01 DIAGNOSIS — Z8659 Personal history of other mental and behavioral disorders: Secondary | ICD-10-CM | POA: Insufficient documentation

## 2015-01-01 DIAGNOSIS — R109 Unspecified abdominal pain: Secondary | ICD-10-CM | POA: Diagnosis not present

## 2015-01-01 DIAGNOSIS — Z792 Long term (current) use of antibiotics: Secondary | ICD-10-CM | POA: Insufficient documentation

## 2015-01-01 DIAGNOSIS — Z791 Long term (current) use of non-steroidal anti-inflammatories (NSAID): Secondary | ICD-10-CM | POA: Insufficient documentation

## 2015-01-01 LAB — BASIC METABOLIC PANEL
ANION GAP: 9 (ref 5–15)
BUN: 15 mg/dL (ref 6–20)
CO2: 27 mmol/L (ref 22–32)
Calcium: 9.3 mg/dL (ref 8.9–10.3)
Chloride: 106 mmol/L (ref 101–111)
Creatinine, Ser: 1 mg/dL (ref 0.61–1.24)
GFR calc non Af Amer: >60 mL/min (ref >60)
Glucose, Bld: 91 mg/dL (ref 65–99)
POTASSIUM: 3.9 mmol/L (ref 3.5–5.1)
Sodium: 142 mmol/L (ref 135–145)

## 2015-01-01 LAB — URINALYSIS, ROUTINE W REFLEX MICROSCOPIC
BILIRUBIN URINE: NEGATIVE
GLUCOSE, UA: NEGATIVE mg/dL
HGB URINE DIPSTICK: NEGATIVE
KETONES UR: 15 mg/dL — AB
Leukocytes, UA: NEGATIVE
Nitrite: NEGATIVE
PROTEIN: NEGATIVE mg/dL
Specific Gravity, Urine: 1.023 (ref 1.005–1.030)
Urobilinogen, UA: 0.2 mg/dL (ref 0.0–1.0)
pH: 7.5 (ref 5.0–8.0)

## 2015-01-01 MED ORDER — TRAMADOL HCL 50 MG PO TABS: 50.0000 mg | ORAL_TABLET | Freq: Four times a day (QID) | ORAL | Status: DC | PRN

## 2015-01-01 MED ORDER — HYDROMORPHONE HCL 1 MG/ML IJ SOLN
1.0000 mg | Freq: Once | INTRAMUSCULAR | Status: AC
  Administered 2015-01-01: 1 mg via INTRAVENOUS
  Filled 2015-01-01: qty 1

## 2015-01-01 MED ORDER — ONDANSETRON HCL 4 MG/2ML IJ SOLN
4.0000 mg | Freq: Once | INTRAMUSCULAR | Status: AC
  Administered 2015-01-01: 4 mg via INTRAVENOUS
  Filled 2015-01-01: qty 2

## 2015-01-01 MED ORDER — SODIUM CHLORIDE 0.9 % IV BOLUS (SEPSIS)
1000.0000 mL | Freq: Once | INTRAVENOUS | Status: AC
Start: 2015-01-01 — End: 2015-01-01
  Administered 2015-01-01: 1000 mL via INTRAVENOUS

## 2015-01-01 NOTE — ED Notes (Signed)
Pt c/o sudden onset of left flank pain x 1 day HX stones

## 2015-01-01 NOTE — ED Provider Notes (Signed)
CSN: [id number]     Arrival date & time 01/01/15  1430 History   First MD Initiated Contact with Patient 01/01/15 1438     Chief Complaint  Patient presents with  . Flank Pain     (Consider location/radiation/quality/duration/timing/severity/associated sxs/prior Treatment) Patient is a 39 y.o. male presenting with flank pain. The history is provided by the patient. No language interpreter was used.  Flank Pain This is a new problem. The current episode started yesterday. The problem occurs constantly. The problem has been unchanged. Associated symptoms include chills. Pertinent negatives include no abdominal pain or fever. Nothing aggravates the symptoms. He has tried NSAIDs, heat and ice for the symptoms. The treatment provided no relief.    Past Medical History  Diagnosis Date  . Depression   . Kidney stones    Past Surgical History  Procedure Laterality Date  . Appendectomy     History reviewed. No pertinent family history. History  Substance Use Topics  . Smoking status: Former Smoker -- 0.50 packs/day    Types: Cigarettes  . Smokeless tobacco: Not on file  . Alcohol Use: No    Review of Systems  Constitutional: Positive for chills. Negative for fever.  Gastrointestinal: Negative for abdominal pain.  Genitourinary: Positive for flank pain.  All other systems reviewed and are negative.     Allergies  Review of patient's allergies indicates no known allergies.  Home Medications   Prior to Admission medications   Medication Sig Start Date End Date Taking? Authorizing Provider  ibuprofen (ADVIL,MOTRIN) 800 MG tablet Take 1 tablet (800 mg total) by mouth 3 (three) times daily. 05/08/13   Glynn OctaveStephen Rancour, MD  sulfamethoxazole-trimethoprim (BACTRIM DS) 800-160 MG per tablet Take 1 tablet by mouth 2 (two) times daily. 02/07/14   Morrell RiddleSarah L Weber, PA-C  zolpidem (AMBIEN) 10 MG tablet Take 10 mg by mouth at bedtime as needed. For sleep.    Historical Provider, MD   BP 123/87  mmHg  Pulse 90  Temp(Src) 98.3 F (36.8 C) (Oral)  Resp 18  Ht 6' (1.829 m)  Wt 172 lb (78.019 kg)  BMI 23.32 kg/m2  SpO2 100% Physical Exam  Constitutional: He is oriented to person, place, and time. He appears well-developed and well-nourished.  Cardiovascular: Normal rate and regular rhythm.   Pulmonary/Chest: Effort normal and breath sounds normal.  Abdominal: Soft. Bowel sounds are normal. There is no tenderness. There is no CVA tenderness.  Musculoskeletal: Normal range of motion.  Neurological: He is alert and oriented to person, place, and time.  Skin: Skin is warm and dry.  Psychiatric: He has a normal mood and affect.  Nursing note and vitals reviewed.   ED Course  Procedures (including critical care time) Labs Review Labs Reviewed  URINALYSIS, ROUTINE W REFLEX MICROSCOPIC - Abnormal; Notable for the following:    APPearance CLOUDY (*)    Ketones, ur 15 (*)    All other components within normal limits  BASIC METABOLIC PANEL    Imaging Review Ct Renal Stone Study  01/01/2015   CLINICAL DATA:  Left flank pain and history of renal calculi.  EXAM: CT ABDOMEN AND PELVIS WITHOUT CONTRAST  TECHNIQUE: Multidetector CT imaging of the abdomen and pelvis was performed following the standard protocol without IV contrast.  COMPARISON:  10/19/2010  FINDINGS: No renal or ureteral calculi are identified. No hydronephrosis identified bilaterally. The bladder is decompressed and unremarkable in appearance.  Unenhanced appearance of the liver, gallbladder, pancreas, spleen, adrenal glands and bowel are unremarkable.  No acute inflammatory process is identified. There is no evidence of free fluid or abscess.  No masses, enlarged lymph nodes or hernias are identified. Visualized lung bases are unremarkable. Bony structures are within normal limits.  IMPRESSION: No acute findings by unenhanced CT of the abdomen and pelvis. No evidence of renal or ureteral calculi. No hydronephrosis.    Electronically Signed   By: Irish LackGlenn  Yamagata M.D.   On: 01/01/2015 15:53     EKG Interpretation None      MDM   Final diagnoses:  Flank pain    No acute reason for flank pain. Pt given ultram for pain and discussed follow up with pcp for continued symptoms    Teressa LowerVrinda Iori Gigante, NP 01/01/15 1621  Geoffery Lyonsouglas Delo, MD 01/02/15 1308

## 2015-01-01 NOTE — ED Notes (Signed)
Pt drove self to the ER. Pt is attempting to find someone to come and pick him up.

## 2015-01-01 NOTE — Discharge Instructions (Signed)
Flank Pain °Flank pain refers to pain that is located on the side of the body between the upper abdomen and the back. The pain may occur over a short period of time (acute) or may be long-term or reoccurring (chronic). It may be mild or severe. Flank pain can be caused by many things. °CAUSES  °Some of the more common causes of flank pain include: °· Muscle strains.   °· Muscle spasms.   °· A disease of your spine (vertebral disk disease).   °· A lung infection (pneumonia).   °· Fluid around your lungs (pulmonary edema).   °· A kidney infection.   °· Kidney stones.   °· A very painful skin rash caused by the chickenpox virus (shingles).   °· Gallbladder disease.   °HOME CARE INSTRUCTIONS  °Home care will depend on the cause of your pain. In general, °· Rest as directed by your caregiver. °· Drink enough fluids to keep your urine clear or pale yellow. °· Only take over-the-counter or prescription medicines as directed by your caregiver. Some medicines may help relieve the pain. °· Tell your caregiver about any changes in your pain. °· Follow up with your caregiver as directed. °SEEK IMMEDIATE MEDICAL CARE IF:  °· Your pain is not controlled with medicine.   °· You have new or worsening symptoms. °· Your pain increases.   °· You have abdominal pain.   °· You have shortness of breath.   °· You have persistent nausea or vomiting.   °· You have swelling in your abdomen.   °· You feel faint or pass out.   °· You have blood in your urine. °· You have a fever or persistent symptoms for more than 2-3 days. °· You have a fever and your symptoms suddenly get worse. °MAKE SURE YOU:  °· Understand these instructions. °· Will watch your condition. °· Will get help right away if you are not doing well or get worse. °Document Released: 09/23/2005 Document Revised: 04/26/2012 Document Reviewed: 03/16/2012 °[employer]® Patient Information ©2015 [employer]. This information is not intended to replace advice given to you by your  health care provider. Make sure you discuss any questions you have with your health care provider. ° °

## 2016-04-03 IMAGING — CT CT RENAL STONE PROTOCOL
2 of 4 series · 16 of 46 positions shown, 18 images · non-contrast
Comparison: 10/19/2010

CLINICAL DATA: Left flank pain and history of renal calculi.

EXAM:
CT ABDOMEN AND PELVIS WITHOUT CONTRAST
TECHNIQUE: Multidetector CT imaging of the abdomen and pelvis was performed
following the standard protocol without IV contrast.

[Series 2: renal stone < 200 lbs 5.0 b31f · axial · 0.75mm/px · z∈[-274,+176]mm · 13 of 100 slices shown, 15 images]
[im 5/100  soft-tissue]
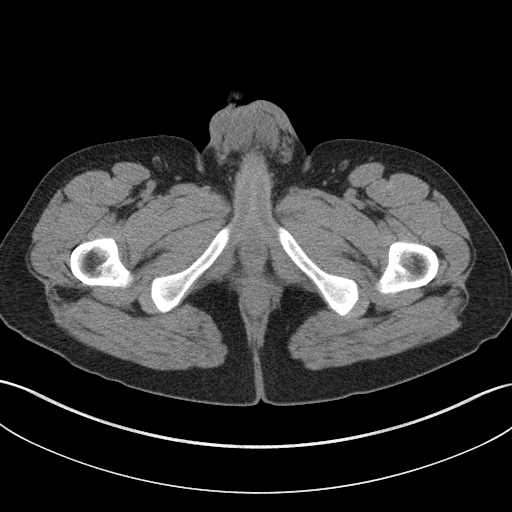
[im 5/100  bone]
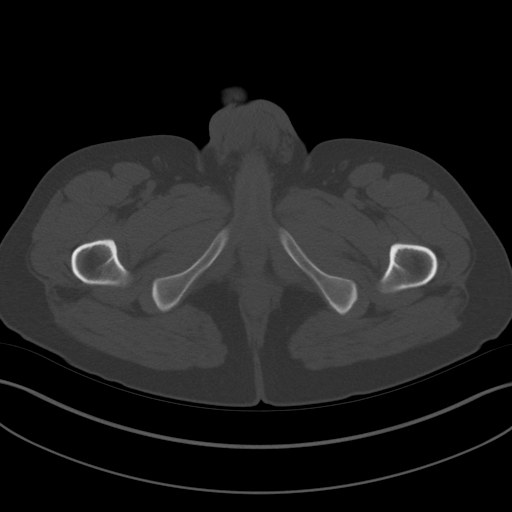
[im 13/100  soft-tissue]
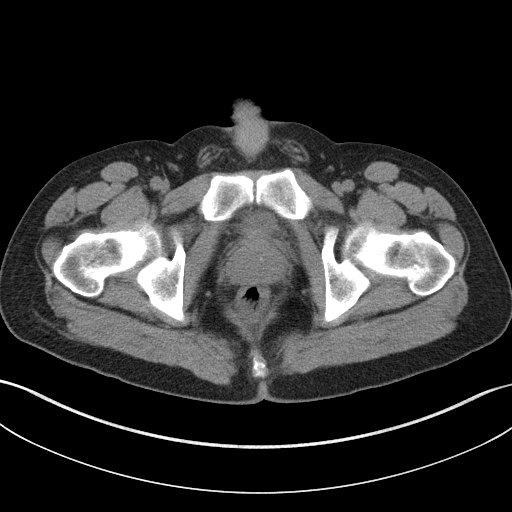
[im 22/100  soft-tissue]
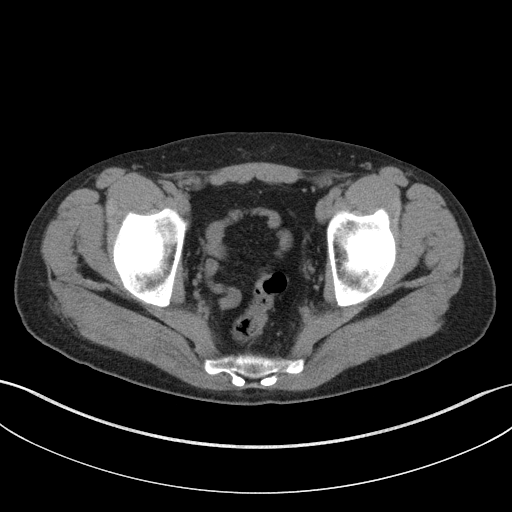
[im 26/100  soft-tissue]
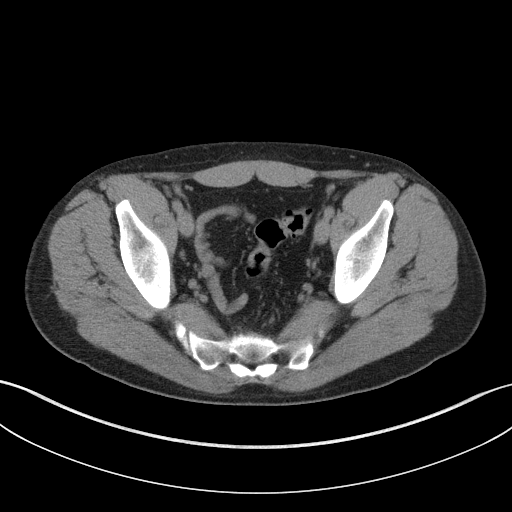
[im 35/100  soft-tissue]
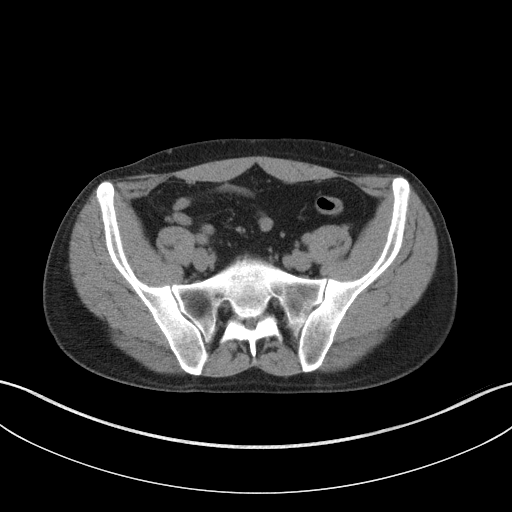
[im 44/100  soft-tissue]
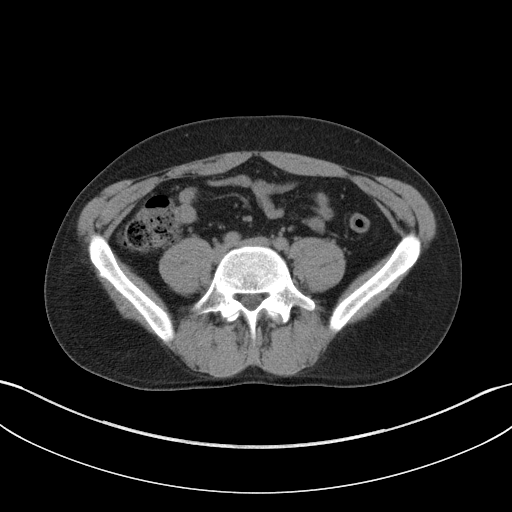
[im 52/100  soft-tissue]
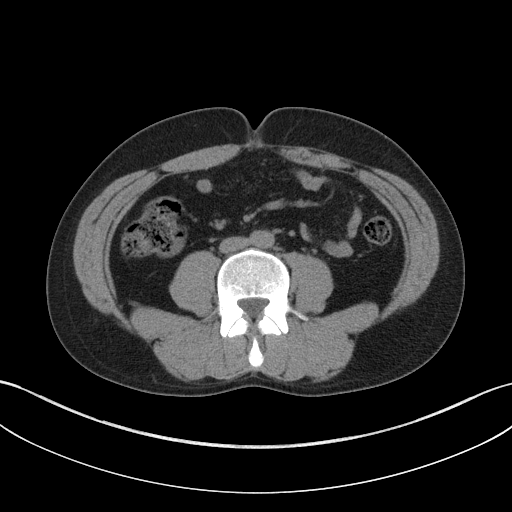
[im 56/100  soft-tissue]
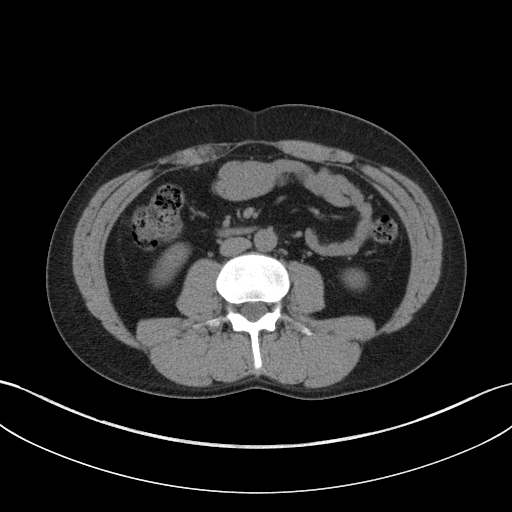
[im 65/100  soft-tissue]
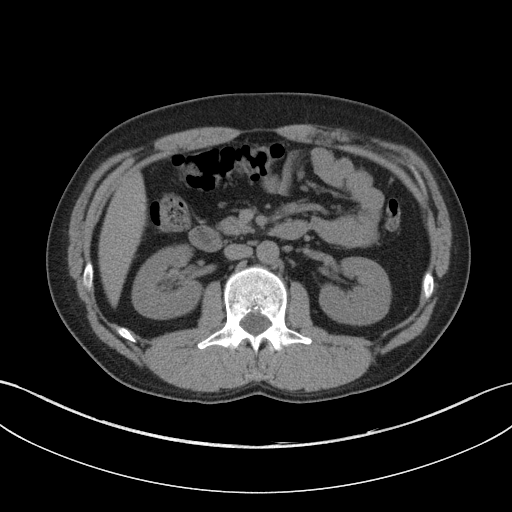
[im 65/100  bone]
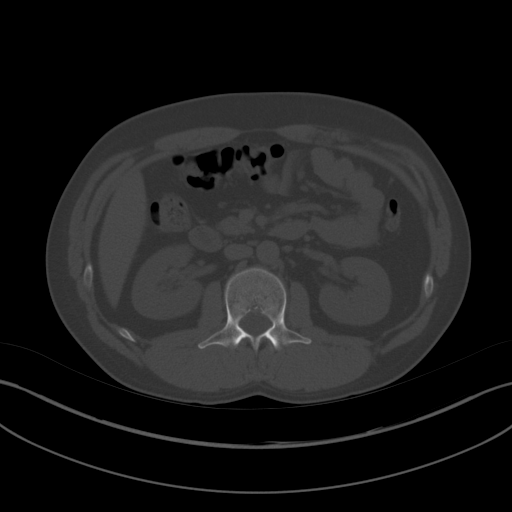
[im 74/100  soft-tissue]
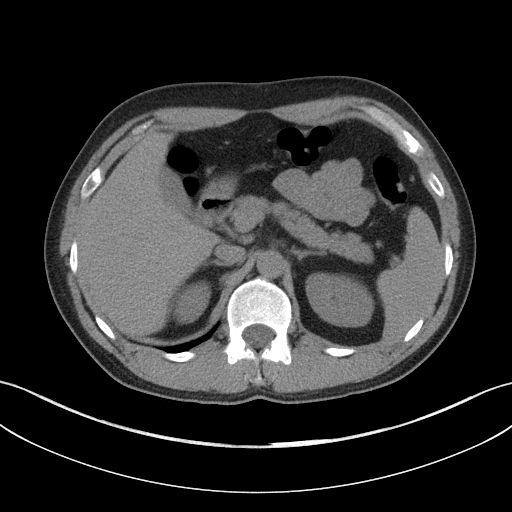
[im 78/100  soft-tissue]
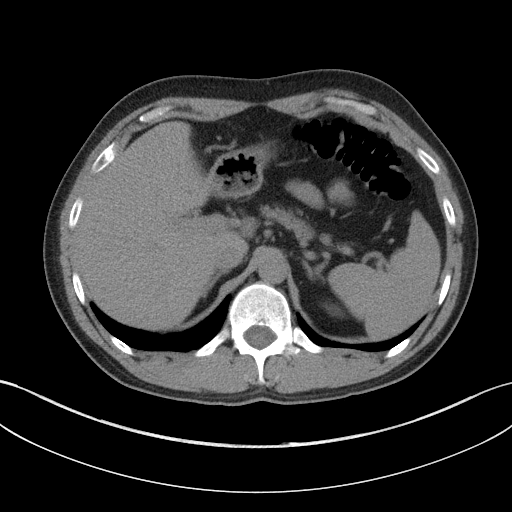
[im 87/100  soft-tissue]
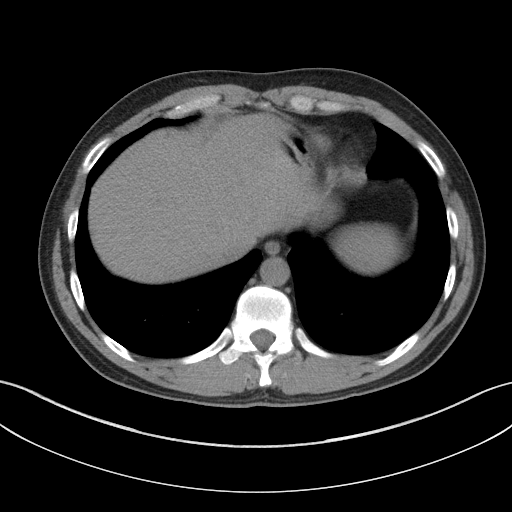
[im 95/100  soft-tissue]
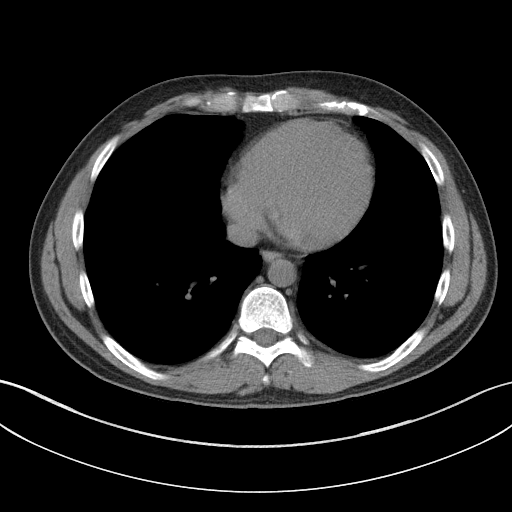

[Series 5: renal stone 3.0 coronal · coronal · 0.69mm/px · 3 of 84 slices shown]
[im 28/84  soft-tissue]
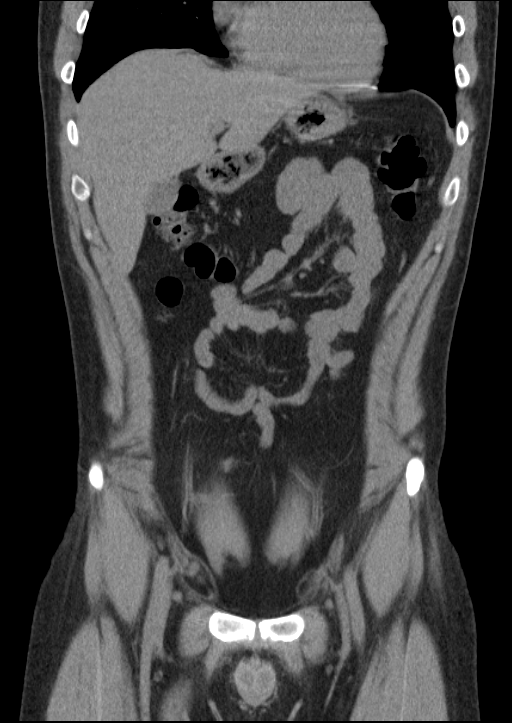
[im 37/84  soft-tissue]
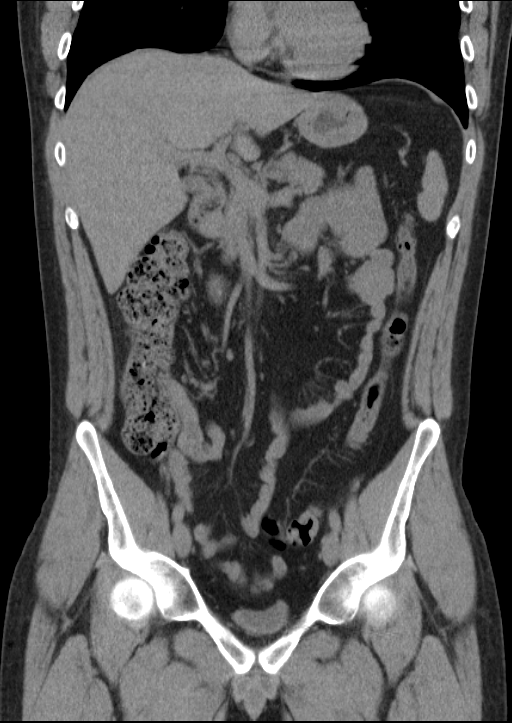
[im 47/84  soft-tissue]
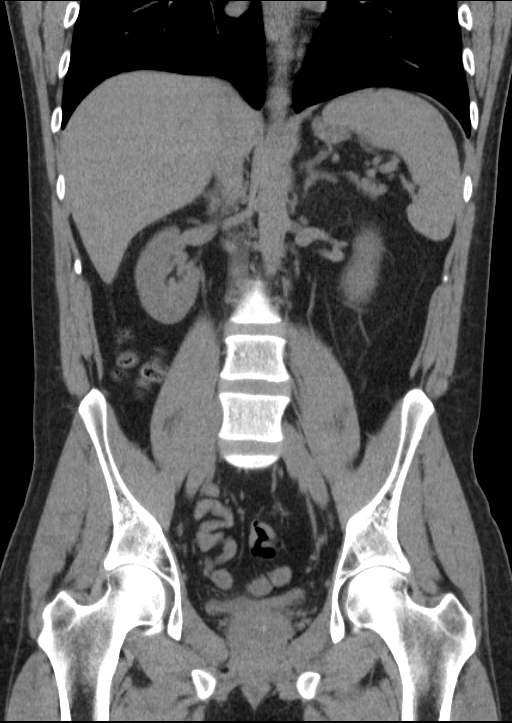

[16 of 46 positions shown; findings below may reference images not displayed]

FINDINGS: No renal or ureteral calculi are identified. No hydronephrosis
identified bilaterally. The bladder is decompressed and unremarkable
in appearance.

Unenhanced appearance of the liver, gallbladder, pancreas, spleen,
adrenal glands and bowel are unremarkable. No acute inflammatory
process is identified. There is no evidence of free fluid or
abscess.

No masses, enlarged lymph nodes or hernias are identified.
Visualized lung bases are unremarkable. Bony structures are within
normal limits.
IMPRESSION: No acute findings by unenhanced CT of the abdomen and pelvis. No
evidence of renal or ureteral calculi. No hydronephrosis.

## 2017-11-01 DIAGNOSIS — Z720 Tobacco use: Secondary | ICD-10-CM | POA: Diagnosis not present

## 2017-11-13 DIAGNOSIS — N200 Calculus of kidney: Secondary | ICD-10-CM | POA: Diagnosis not present

## 2017-11-14 DIAGNOSIS — Z87442 Personal history of urinary calculi: Secondary | ICD-10-CM | POA: Diagnosis not present

## 2017-11-14 DIAGNOSIS — R109 Unspecified abdominal pain: Secondary | ICD-10-CM | POA: Diagnosis not present

## 2018-03-26 DIAGNOSIS — R002 Palpitations: Secondary | ICD-10-CM | POA: Diagnosis not present

## 2019-07-26 ENCOUNTER — Other Ambulatory Visit: Payer: Self-pay

## 2019-07-26 ENCOUNTER — Emergency Department (HOSPITAL_BASED_OUTPATIENT_CLINIC_OR_DEPARTMENT_OTHER): Payer: No Typology Code available for payment source

## 2019-07-26 ENCOUNTER — Emergency Department (HOSPITAL_BASED_OUTPATIENT_CLINIC_OR_DEPARTMENT_OTHER)
Admission: EM | Admit: 2019-07-26 | Discharge: 2019-07-26 | Disposition: A | Discharge status: Home / Self Care | Payer: No Typology Code available for payment source | Attending: Emergency Medicine | Admitting: Emergency Medicine

## 2019-07-26 ENCOUNTER — Encounter (HOSPITAL_BASED_OUTPATIENT_CLINIC_OR_DEPARTMENT_OTHER): Payer: Self-pay | Admitting: *Deleted

## 2019-07-26 DIAGNOSIS — K5792 Diverticulitis of intestine, part unspecified, without perforation or abscess without bleeding: Secondary | ICD-10-CM

## 2019-07-26 DIAGNOSIS — K5732 Diverticulitis of large intestine without perforation or abscess without bleeding: Secondary | ICD-10-CM | POA: Diagnosis not present

## 2019-07-26 DIAGNOSIS — Z87891 Personal history of nicotine dependence: Secondary | ICD-10-CM | POA: Insufficient documentation

## 2019-07-26 DIAGNOSIS — R1030 Lower abdominal pain, unspecified: Secondary | ICD-10-CM | POA: Diagnosis present

## 2019-07-26 LAB — COMPREHENSIVE METABOLIC PANEL
ALT: 19 U/L (ref 0–44)
AST: 19 U/L (ref 15–41)
Albumin: 4.3 g/dL (ref 3.5–5.0)
Alkaline Phosphatase: 58 U/L (ref 38–126)
Anion gap: 7 (ref 5–15)
BUN: 16 mg/dL (ref 6–20)
CO2: 29 mmol/L (ref 22–32)
Calcium: 9.4 mg/dL (ref 8.9–10.3)
Chloride: 100 mmol/L (ref 98–111)
Creatinine, Ser: 1.01 mg/dL (ref 0.61–1.24)
GFR calc Af Amer: >60 mL/min (ref >60)
GFR calc non Af Amer: >60 mL/min (ref >60)
Glucose, Bld: 87 mg/dL (ref 70–99)
Potassium: 3.9 mmol/L (ref 3.5–5.1)
Sodium: 136 mmol/L (ref 135–145)
Total Bilirubin: 0.8 mg/dL (ref 0.3–1.2)
Total Protein: 7.4 g/dL (ref 6.5–8.1)

## 2019-07-26 LAB — CBC WITH DIFFERENTIAL/PLATELET
Abs Immature Granulocytes: 0.02 10*3/uL (ref 0.00–0.07)
Basophils Absolute: 0.1 10*3/uL (ref 0.0–0.1)
Basophils Relative: 1 %
Eosinophils Absolute: 0.2 10*3/uL (ref 0.0–0.5)
Eosinophils Relative: 2 %
HCT: 46.6 % (ref 39.0–52.0)
Hemoglobin: 15.6 g/dL (ref 13.0–17.0)
Immature Granulocytes: 0 %
Lymphocytes Relative: 30 %
Lymphs Abs: 2.9 10*3/uL (ref 0.7–4.0)
MCH: 30.1 pg (ref 26.0–34.0)
MCHC: 33.5 g/dL (ref 30.0–36.0)
MCV: 90 fL (ref 80.0–100.0)
Monocytes Absolute: 0.9 10*3/uL (ref 0.1–1.0)
Monocytes Relative: 9 %
Neutro Abs: 5.5 10*3/uL (ref 1.7–7.7)
Neutrophils Relative %: 58 %
Platelets: 443 10*3/uL — ABNORMAL HIGH (ref 150–400)
RBC: 5.18 MIL/uL (ref 4.22–5.81)
RDW: 11.8 % (ref 11.5–15.5)
WBC: 9.6 10*3/uL (ref 4.0–10.5)
nRBC: 0 % (ref 0.0–0.2)

## 2019-07-26 LAB — URINALYSIS, ROUTINE W REFLEX MICROSCOPIC
Bilirubin Urine: NEGATIVE
Glucose, UA: NEGATIVE mg/dL
Hgb urine dipstick: NEGATIVE
Ketones, ur: NEGATIVE mg/dL
Leukocytes,Ua: NEGATIVE
Nitrite: NEGATIVE
Protein, ur: NEGATIVE mg/dL
Specific Gravity, Urine: 1.02 (ref 1.005–1.030)
pH: 6 (ref 5.0–8.0)

## 2019-07-26 LAB — LIPASE, BLOOD: Lipase: 20 U/L (ref 11–51)

## 2019-07-26 MED ORDER — ONDANSETRON 4 MG PO TBDP: 4.0000 mg | ORAL_TABLET | Freq: Three times a day (TID) | ORAL | 0 refills | Status: DC | PRN

## 2019-07-26 MED ORDER — AMOXICILLIN-POT CLAVULANATE 875-125 MG PO TABS: 1.0000 | ORAL_TABLET | Freq: Two times a day (BID) | ORAL | 0 refills | Status: AC

## 2019-07-26 MED ORDER — IOHEXOL 300 MG/ML  SOLN
100.0000 mL | Freq: Once | INTRAMUSCULAR | Status: AC | PRN
  Administered 2019-07-26: 100 mL via INTRAVENOUS

## 2019-07-26 MED ORDER — DICYCLOMINE HCL 20 MG PO TABS: 20.0000 mg | ORAL_TABLET | Freq: Three times a day (TID) | ORAL | 0 refills | Status: DC | PRN

## 2019-07-26 NOTE — Discharge Instructions (Signed)
We believe your symptoms are caused by diverticulitis.  Most of the time this condition (please read through the included information) can be cured with outpatient antibiotics.  Please take the full course of prescribed medication(s) and follow up with the doctors recommended above.  Return to the ED if your abdominal pain worsens or fails to improve, you develop bloody vomiting, bloody diarrhea, you are unable to tolerate fluids due to vomiting, fever greater than 101, or other symptoms that concern you.    

## 2019-07-26 NOTE — ED Triage Notes (Signed)
Abdominal pain and bloating x 2 days. itching on his legs and abdomen. No vomiting. No diarrhea.

## 2019-07-26 NOTE — ED Provider Notes (Signed)
Emergency Department Provider Note   I have reviewed the triage vital signs and the nursing notes.   HISTORY  Chief Complaint Abdominal Pain   HPI Bobby Bailey is a 43 y.o. male presents to the emergency department for evaluation of abdominal pain in the lower abdomen with some bloating over the past 2 days.  He reports some itching in this area as well as into his legs.  He denies any vomiting, diarrhea, fever.  Pain is in the suprapubic/lower abdominal area without radiation.  No clear modifying factors.  Patient denies any pain into the genitalia or testicles.  No dysuria, hesitancy, urgency.  He denies any urethral discharge.  No concern for STD.  Denies flank or back pain.  Past Medical History:  Diagnosis Date  . Depression   . Kidney stones     Patient Active Problem List   Diagnosis Date Noted  . Insomnia 02/07/2014  . History of kidney stones 02/07/2014    Past Surgical History:  Procedure Laterality Date  . APPENDECTOMY      Allergies Patient has no known allergies.  No family history on file.  Social History Social History   Tobacco Use  . Smoking status: Former Smoker    Packs/day: 0.50    Types: Cigarettes  . Smokeless tobacco: Never Used  Substance Use Topics  . Alcohol use: No  . Drug use: No    Review of Systems  Constitutional: No fever/chills. Positive itching.  Eyes: No visual changes. ENT: No sore throat. Cardiovascular: Denies chest pain. Respiratory: Denies shortness of breath. Gastrointestinal: Positive lower abdominal pain.  No nausea, no vomiting.  No diarrhea.  No constipation. Genitourinary: Negative for dysuria. Musculoskeletal: Negative for back pain. Skin: Negative for rash. Neurological: Negative for headaches, focal weakness or numbness.  10-point ROS otherwise negative.  ____________________________________________   PHYSICAL EXAM:  VITAL SIGNS: ED Triage Vitals  Enc Vitals Group     BP 07/26/19 1803 (!)  137/94     Pulse Rate 07/26/19 1803 65     Resp 07/26/19 1803 18     Temp 07/26/19 1803 98.3 F (36.8 C)     Temp Source 07/26/19 1803 Oral     SpO2 07/26/19 1803 100 %     Weight 07/26/19 1801 198 lb (89.8 kg)     Height 07/26/19 1801 6' (1.829 m)   Constitutional: Alert and oriented. Well appearing and in no acute distress. Eyes: Conjunctivae are normal. Head: Atraumatic. Nose: No congestion/rhinnorhea. Mouth/Throat: Mucous membranes are moist.  Neck: No stridor. Cardiovascular: Normal rate, regular rhythm. Good peripheral circulation. Grossly normal heart sounds.   Respiratory: Normal respiratory effort.  No retractions. Lungs CTAB. Gastrointestinal: Soft with mild lower abdominal tenderness. No rebound or guarding. No RUQ tenderness. No distention.  Genitourinary: No inguinal hernia.  Musculoskeletal: No gross deformities of extremities. Neurologic:  Normal speech and language. Skin:  Skin is warm, dry and intact. No rash noted.   ____________________________________________   LABS (all labs ordered are listed, but only abnormal results are displayed)  Labs Reviewed  CBC WITH DIFFERENTIAL/PLATELET - Abnormal; Notable for the following components:      Result Value   Platelets 443 (*)    All other components within normal limits  URINALYSIS, ROUTINE W REFLEX MICROSCOPIC  COMPREHENSIVE METABOLIC PANEL  LIPASE, BLOOD   ____________________________________________  RADIOLOGY  CT ABDOMEN PELVIS W CONTRAST  Result Date: 07/26/2019 CLINICAL DATA:  Generalized abdominal pain with fever EXAM: CT ABDOMEN AND PELVIS WITH CONTRAST TECHNIQUE:  Multidetector CT imaging of the abdomen and pelvis was performed using the standard protocol following bolus administration of intravenous contrast. CONTRAST:  100mL OMNIPAQUE IOHEXOL 300 MG/ML  SOLN COMPARISON:  CT abdomen pelvis 11/14/2017, abdominal radiograph 07/26/2019 FINDINGS: Lower chest: Lung bases are clear. Normal heart size. No  pericardial effusion. Hepatobiliary: Focal fatty infiltration along the falciform ligament. No concerning hepatic lesions. Normal gallbladder. No frank biliary ductal dilatation. Pancreas: Unremarkable. No pancreatic ductal dilatation or surrounding inflammatory changes. Spleen: Normal in size without focal abnormality. Adrenals/Urinary Tract: Normal adrenal glands. Punctate nonobstructing upper pole left renal calculus. Subcentimeter hypoattenuating focus in the interpolar left kidney is too small to fully characterize on CT imaging but statistically likely benign. Kidneys enhance and excrete symmetrically. Urinary bladder is largely decompressed at the time of exam and therefore poorly evaluated by CT imaging. Stomach/Bowel: Distal esophagus, stomach and duodenal sweep are unremarkable. No small bowel wall thickening or dilatation. No evidence of obstruction. The appendix is surgically absent. Segmental thickening of the proximal sigmoid colon with focal pericolonic inflammation which appears centered on a small diverticular outpouching (coronal 5/51) no extraluminal gas, organized fluid collection or abscess is seen. Remaining portions of colon have a more normal appearance. Vascular/Lymphatic: The aorta is normal caliber. No suspicious or enlarged lymph nodes in the included lymphatic chains. Reproductive: The prostate and seminal vesicles are unremarkable. Other: No abdominopelvic free fluid or free gas. No bowel containing hernias. Musculoskeletal: Stable sclerotic lesion in the left ilium, likely benign lesion with some chondroid features suggesting possible enchondroma. No acute osseous abnormality or suspicious osseous lesion. IMPRESSION: 1. Segmental thickening of the proximal sigmoid colon with focal pericolonic inflammation which appears centered on a small diverticular outpouching, most consistent with acute diverticulitis. No evidence for perforation or abscess formation. 2. Punctate nonobstructing  upper pole left renal calculus. No obstructive urolithiasis or hydronephrosis. Electronically Signed   By: Kreg ShropshirePrice  DeHay M.D.   On: 07/26/2019 22:02    ____________________________________________   PROCEDURES  Procedure(s) performed:   Procedures  None ____________________________________________   INITIAL IMPRESSION / ASSESSMENT AND PLAN / ED COURSE  Pertinent labs & imaging results that were available during my care of the patient were reviewed by me and considered in my medical decision making (see chart for details).   Patient presents to the emergency department with lower abdominal pain with itching and bloating.  He has no tenderness over the gallbladder.  LFTs and bilirubin are normal.  CT obtained given focal abdominal tenderness and found to have uncomplicated diverticulitis.  Plan for pain management and antibiotics as an outpatient.  Provided contact information for gastroenterology and encouraged follow-up in 2 to 3 weeks for consideration of outpatient colonoscopy.  Discussed ED return precautions.   ____________________________________________  FINAL CLINICAL IMPRESSION(S) / ED DIAGNOSES  Final diagnoses:  Diverticulitis  Lower abdominal pain     MEDICATIONS GIVEN DURING THIS VISIT:  Medications  iohexol (OMNIPAQUE) 300 MG/ML solution 100 mL (100 mLs Intravenous Contrast Given 07/26/19 2141)     NEW OUTPATIENT MEDICATIONS STARTED DURING THIS VISIT:  Discharge Medication List as of 07/26/2019 10:10 PM    START taking these medications   Details  amoxicillin-clavulanate (AUGMENTIN) 875-125 MG tablet Take 1 tablet by mouth 2 (two) times daily for 10 days., Starting Thu 07/26/2019, Until Sun 08/05/2019, Print    dicyclomine (BENTYL) 20 MG tablet Take 1 tablet (20 mg total) by mouth 3 (three) times daily as needed for spasms (abdominal cramping)., Starting Thu 07/26/2019, Print    ondansetron (  ZOFRAN ODT) 4 MG disintegrating tablet Take 1 tablet (4 mg  total) by mouth every 8 (eight) hours as needed for nausea or vomiting., Starting Thu 07/26/2019, Print        Note:  This document was prepared using Dragon voice recognition software and may include unintentional dictation errors.  Alona Bene, MD, [facility]    Stephanine Reas, Arlyss Repress, MD 07/27/19 1242

## 2020-02-08 DIAGNOSIS — F1121 Opioid dependence, in remission: Secondary | ICD-10-CM | POA: Insufficient documentation

## 2020-08-20 ENCOUNTER — Emergency Department (HOSPITAL_BASED_OUTPATIENT_CLINIC_OR_DEPARTMENT_OTHER)
Admission: EM | Admit: 2020-08-20 | Discharge: 2020-08-20 | Disposition: A | Discharge status: Home / Self Care | Payer: No Typology Code available for payment source | Attending: Emergency Medicine | Admitting: Emergency Medicine

## 2020-08-20 ENCOUNTER — Encounter (HOSPITAL_BASED_OUTPATIENT_CLINIC_OR_DEPARTMENT_OTHER): Payer: Self-pay

## 2020-08-20 ENCOUNTER — Emergency Department (HOSPITAL_BASED_OUTPATIENT_CLINIC_OR_DEPARTMENT_OTHER): Payer: No Typology Code available for payment source

## 2020-08-20 ENCOUNTER — Other Ambulatory Visit: Payer: Self-pay

## 2020-08-20 DIAGNOSIS — R002 Palpitations: Secondary | ICD-10-CM | POA: Diagnosis present

## 2020-08-20 DIAGNOSIS — Z87891 Personal history of nicotine dependence: Secondary | ICD-10-CM | POA: Diagnosis not present

## 2020-08-20 DIAGNOSIS — R202 Paresthesia of skin: Secondary | ICD-10-CM | POA: Diagnosis not present

## 2020-08-20 LAB — BASIC METABOLIC PANEL
Anion gap: 8 (ref 5–15)
BUN: 17 mg/dL (ref 6–20)
CO2: 25 mmol/L (ref 22–32)
Calcium: 9 mg/dL (ref 8.9–10.3)
Chloride: 104 mmol/L (ref 98–111)
Creatinine, Ser: 0.85 mg/dL (ref 0.61–1.24)
GFR, Estimated: >60 mL/min (ref >60)
Glucose, Bld: 95 mg/dL (ref 70–99)
Potassium: 4.1 mmol/L (ref 3.5–5.1)
Sodium: 137 mmol/L (ref 135–145)

## 2020-08-20 LAB — CBC
HCT: 45.2 % (ref 39.0–52.0)
Hemoglobin: 15.6 g/dL (ref 13.0–17.0)
MCH: 30.8 pg (ref 26.0–34.0)
MCHC: 34.5 g/dL (ref 30.0–36.0)
MCV: 89.3 fL (ref 80.0–100.0)
Platelets: 424 10*3/uL — ABNORMAL HIGH (ref 150–400)
RBC: 5.06 MIL/uL (ref 4.22–5.81)
RDW: 11.9 % (ref 11.5–15.5)
WBC: 6.7 10*3/uL (ref 4.0–10.5)
nRBC: 0 % (ref 0.0–0.2)

## 2020-08-20 LAB — TROPONIN I (HIGH SENSITIVITY)
Troponin I (High Sensitivity): 2 ng/L (ref <18)
Troponin I (High Sensitivity): 2 ng/L (ref <18)

## 2020-08-20 NOTE — Discharge Instructions (Addendum)
You are seen today for palpitations, I want you to call the cardiologist office for Holter monitoring as we spoke about.  They should call you within a week, however if they do not please call them to schedule an appointment.  I would also recommend avoidance of stimulants and alcohol.  Avoid smoking, caffeine and practice stress relief.  Please use the attached instructions.  Your work-up today was reassuring as we spoke about, however your chest x-ray does show concerns for questionable bronchitis, this could be from your vaping, I would highly recommend stopping this.  If you have any new worsening concerning symptoms please come back to the emergency department.  Get help right away if you: Have chest pain or shortness of breath. Have a severe headache. Feel dizzy or you faint.

## 2020-08-20 NOTE — ED Notes (Signed)
Patient verbalized understanding of discharge instructions and follow up.

## 2020-08-20 NOTE — ED Triage Notes (Signed)
Pt reports for the last 4 days his legs and arms are "going to sleep" when he lays down to go to sleep. Pt reports L sided chest pressure along with the feeling that his heart is racing without activity. Pt states he has some shortness of breath, denies dizziness and n/v.

## 2020-08-20 NOTE — ED Provider Notes (Signed)
[facility] EMERGENCY DEPARTMENT Provider Note   CSN: [id number] Arrival date & time: 08/20/20  1026     History Chief Complaint  Patient presents with  . Chest Pain    Bobby Bailey is a 45 y.o. male with no pertinent past medical history that presents the emergency department today for palpitations.  Patient states for the past 2 weeks he has noticed that his heart is racing out of his chest, last a couple minutes and then will resolve on itself.  Denies any shortness of breath or chest pain.  Triage note states that he has chest pressure and shortness of breath, when speaking to patient about this he states that it is uncomfortable when he is having palpitations but he does not have any pain or pressure.  Patient denies any shortness of breath, rather states that it is an uncomfortable feeling when he has the palpitations.  Denies any difficulty breathing.  Denies any fevers or chills.  Denies any nausea or vomiting.  Denies any dizziness or syncope.  Denies any vision changes, leg swelling.  Denies any cardiac history.  Palpitations are not exertional.  Also admits to some numbness in his legs and his hands when he falls asleep.  Denies any weakness in either extremity.  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  Patient also does admit to drinking caffeine including coffee every morning in addition to soft drink.  Does also admit to vaping frequently for the last 8 years, does not smoke cigarettes.  No other stimulant use.  Does not drink alcohol.  Denies any headache or dizziness.  No other complaints at this time.  States that he has not had any episodes while he has been here in the room. Denies any cough or URI symptoms.  Patient states that he did have similar symptoms 6 months ago, at that time he saw urgent care who diagnosed him with a " fast heart rate" and sent him for cardiology referral,  patient states that his symptoms went away and therefore did not end up seeing cardiology.  States that he has not had symptoms since 6 months.  HPI     Past Medical History:  Diagnosis Date  . Depression   . Kidney stones     Patient Active Problem List   Diagnosis Date Noted  . Insomnia 02/07/2014  . History of kidney stones 02/07/2014    Past Surgical History:  Procedure Laterality Date  . APPENDECTOMY         History reviewed. No pertinent family history.  Social History   Tobacco Use  . Smoking status: Former Smoker    Packs/day: 0.50    Types: Cigarettes  . Smokeless tobacco: Never Used  Substance Use Topics  . Alcohol use: No  . Drug use: No    Home Medications Prior to Admission medications   Medication Sig Start Date End Date Taking? Authorizing Provider  buprenorphine (SUBUTEX) 8 MG SUBL SL tablet Place 0.5 tablets under the tongue 3 (three) times daily. 08/07/20  Yes [provider]  Cyanocobalamin (VITAMIN B-12 PO) Take 1 tablet by mouth daily.   Yes [provider]  VITAMIN D PO Take 1 tablet by mouth daily.   Yes [provider]  VITAMIN E PO Take 1 tablet by mouth daily.   Yes [provider]    Allergies    Patient has no known allergies.  Review of Systems   Review of Systems  Constitutional: Negative for chills, diaphoresis, fatigue and fever.  HENT: Negative for congestion, sore throat and trouble swallowing.   Eyes: Negative for pain and visual disturbance.  Respiratory: Negative for apnea, cough, chest tightness, shortness of breath and wheezing.   Cardiovascular: Positive for palpitations. Negative for chest pain and leg swelling.  Gastrointestinal: Negative for abdominal distention, abdominal pain, diarrhea, nausea and vomiting.  Genitourinary: Negative for difficulty urinating.  Musculoskeletal: Negative for back pain, neck pain and neck stiffness.  Skin: Negative for pallor.  Neurological:  Negative for dizziness, speech difficulty, weakness and headaches.  Psychiatric/Behavioral: Negative for confusion.    Physical Exam Updated Vital Signs BP 122/78 (BP Location: Right Arm)   Pulse 60   Temp 98.4 F (36.9 C) (Oral)   Resp 18   Ht 6' (1.829 m)   Wt 81.6 kg   SpO2 100%   BMI 24.41 kg/m   Physical Exam Constitutional:      General: He is not in acute distress.    Appearance: Normal appearance. He is not ill-appearing, toxic-appearing or diaphoretic.  HENT:     Mouth/Throat:     Mouth: Mucous membranes are moist.     Pharynx: Oropharynx is clear.  Eyes:     General: No scleral icterus.    Extraocular Movements: Extraocular movements intact.     Pupils: Pupils are equal, round, and reactive to light.  Cardiovascular:     Rate and Rhythm: Normal rate and regular rhythm.     Pulses: Normal pulses.     Heart sounds: Normal heart sounds.  Pulmonary:     Effort: Pulmonary effort is normal. No respiratory distress.     Breath sounds: Normal breath sounds. No stridor. No wheezing, rhonchi or rales.  Chest:     Chest wall: No tenderness.  Abdominal:     General: Abdomen is flat. There is no distension.     Palpations: Abdomen is soft.     Tenderness: There is no abdominal tenderness. There is no guarding or rebound.  Musculoskeletal:        General: No swelling or tenderness. Normal range of motion.     Cervical back: Normal range of motion and neck supple. No rigidity.     Right lower leg: No edema.     Left lower leg: No edema.  Skin:    General: Skin is warm and dry.     Capillary Refill: Capillary refill takes less than 2 seconds.     Coloration: Skin is not pale.  Neurological:     General: No focal deficit present.     Mental Status: He is alert and oriented to person, place, and time.     Cranial Nerves: No cranial nerve deficit.     Sensory: No sensory deficit.     Motor: No weakness.     Coordination: Coordination normal.     Comments: Normal  strength and sensation to upper and lower extremities  Psychiatric:        Mood and Affect: Mood normal.        Behavior: Behavior normal.     Comments: Anxious     ED Results / Procedures / Treatments   Labs (all labs ordered are listed, but only abnormal results are displayed) Labs Reviewed  CBC - Abnormal; Notable for the following components:      Result Value   Platelets 424 (*)    All other components within normal limits  BASIC METABOLIC PANEL  TROPONIN I (HIGH SENSITIVITY)  TROPONIN I (HIGH SENSITIVITY)    EKG EKG Interpretation  Date/Time:  Wednesday August 20 2020 10:33:15 EST Ventricular Rate:  69 PR Interval:  150 QRS Duration: 86 QT Interval:  364 QTC Calculation: 390 R Axis:   8 Text Interpretation: Normal sinus rhythm Cannot rule out Inferior infarct , age undetermined Abnormal ECG No STEMI Confirmed by Alona Bene [phone]) on 08/20/2020 10:54:54 AM   Radiology DG Chest 2 View  Result Date: 08/20/2020 CLINICAL DATA:  Shortness of breath. EXAM: CHEST - 2 VIEW COMPARISON:  05/08/2013. FINDINGS: Mediastinum hilar structures normal. Mild peribronchial cuffing. Mild bronchitis cannot be excluded. No focal infiltrate. No pleural effusion or pneumothorax. Heart size normal. IMPRESSION: Mild peribronchial cuffing. Mild bronchitis cannot be excluded. Electronically Signed   By: Maisie Fus  Register   On: 08/20/2020 11:00    Procedures Procedures (including critical care time)  Medications Ordered in ED Medications - No data to display  ED Course  I have reviewed the triage vital signs and the nursing notes.  Pertinent labs & imaging results that were available during my care of the patient were reviewed by me and considered in my medical decision making (see chart for details).    MDM Rules/Calculators/A&P                          Bobby Bailey is a 45 y.o. male with no pertinent past medical history that presents the emergency department today for palpitations.   Patient without chest pain or shortness of breath.  No red flag palpitations symptoms including no known cardiac disease, no syncope, exertional palpitations, hypotension or heart failure symptoms.  Patient does appear to be anxious, I think this could be contributing to his palpitations.  Work-up today with troponin less than 2, CBC and BMP unremarkable.  Do not think we need second troponin at this time, patient is not complaining of any chest pain.  Chest x-ray does show questionable mild bronchitis, patient without any infectious symptoms.  No cough, fever, shortness of breath with no leukocytosis.  He this is most likely from vaping, did discuss this with patient.   EKG today with no signs of ST elevation, no signs of arrhythmia.  Patient has not had any episodes while here in the room today. I think patient can be discharged at this time, will follow up with cardiology, I think patient will benefit from Holter monitor at this time.  Patient expressed understanding and will follow up with cardiology.  Symptomatic treatment discussed including cessation of stimulants.  Patient is low risk, no cardiac history and without diabetes or hypertension.  Patient be discharged at this time, strict return precautions given.  The patient was counseled on the dangers of tobacco use, and was advised to quit.  Reviewed strategies to maximize success, including removing cigarettes and smoking materials from environment, stress management, substitution of other forms of reinforcement, support of family/friends and written materials. Total time was 5 min CPT code 29937.   Doubt need for further emergent work up at this time. I explained the diagnosis and have given explicit precautions to return to the ER including for any other new or worsening symptoms. The patient understands and accepts the medical plan as it's been dictated and I have answered their questions. Discharge instructions concerning home care and  prescriptions have been given. The patient is STABLE and is discharged to home in good condition.  Final Clinical Impression(s) / ED Diagnoses Final diagnoses:  Palpitations    Rx / DC Orders ED Discharge Orders         Ordered    Ambulatory referral to Cardiology        08/20/20 1556           Farrel Gordon, [place] 08/20/20 1613    Tilden Fossa, MD 08/20/20 1650

## 2020-09-23 ENCOUNTER — Ambulatory Visit (INDEPENDENT_AMBULATORY_CARE_PROVIDER_SITE_OTHER): Payer: No Typology Code available for payment source

## 2020-09-23 ENCOUNTER — Encounter: Payer: Self-pay | Admitting: Cardiology

## 2020-09-23 ENCOUNTER — Other Ambulatory Visit: Payer: Self-pay | Admitting: Cardiology

## 2020-09-23 ENCOUNTER — Ambulatory Visit: Payer: No Typology Code available for payment source | Admitting: Cardiology

## 2020-09-23 ENCOUNTER — Other Ambulatory Visit: Payer: Self-pay

## 2020-09-23 VITALS — BP 120/72 | HR 73 | Ht 72.0 in | Wt 190.0 lb

## 2020-09-23 DIAGNOSIS — R0600 Dyspnea, unspecified: Secondary | ICD-10-CM

## 2020-09-23 DIAGNOSIS — R002 Palpitations: Secondary | ICD-10-CM | POA: Diagnosis not present

## 2020-09-23 DIAGNOSIS — R0609 Other forms of dyspnea: Secondary | ICD-10-CM

## 2020-09-23 DIAGNOSIS — R079 Chest pain, unspecified: Secondary | ICD-10-CM

## 2020-09-23 DIAGNOSIS — R0602 Shortness of breath: Secondary | ICD-10-CM | POA: Diagnosis not present

## 2020-09-23 MED ORDER — NITROGLYCERIN 0.4 MG SL SUBL: 0.4000 mg | SUBLINGUAL_TABLET | SUBLINGUAL | 3 refills | Status: DC | PRN

## 2020-09-23 MED ORDER — METOPROLOL TARTRATE 100 MG PO TABS: ORAL_TABLET | ORAL | 0 refills | Status: DC

## 2020-09-23 MED FILL — NITROGLYCERIN 0.4 MG TAB SL: 0.4 | 7 days supply | Qty: 25 | Fill #0

## 2020-09-23 MED FILL — METOPROLOL TARTRATE 100 MG: 100 | 1 days supply | Qty: 1 | Fill #0

## 2020-09-23 NOTE — Patient Instructions (Signed)
Medication Instructions:  Your physician has recommended you make the following change in your medication:  START: Nitroglycerin 0.4 mg take one tablet by mouth every 5 minutes up to three times as needed for chest pain.  *If you need a refill on your cardiac medications before your next appointment, please call your pharmacy*   Lab Work: Your physician recommends that you return for lab work: TODAY BMET, [place], Tsh  If you have labs (blood work) drawn today and your tests are completely normal, you will receive your results only by: Marland Kitchen MyChart Message (if you have MyChart) OR . A paper copy in the mail If you have any lab test that is abnormal or we need to change your treatment, we will call you to review the results.   Testing/Procedures: Your physician has requested that you have an echocardiogram. Echocardiography is a painless test that uses sound waves to create images of your heart. It provides your doctor with information about the size and shape of your heart and how well your heart's chambers and valves are working. This procedure takes approximately one hour. There are no restrictions for this procedure.  Your cardiac CT will be scheduled at:  [facility] [address] [place], [place] [address] [phone]   If scheduled at [facility], please arrive at the [facility] main entrance of [facility] 30 minutes prior to test start time. Proceed to the [facility] Radiology Department (first floor) to check-in and test prep.  Please follow these instructions carefully (unless otherwise directed):  Hold all erectile dysfunction medications at least 3 days (72 hrs) prior to test.  On the Night Before the Test: . Be sure to Drink plenty of water. . Do not consume any caffeinated/decaffeinated beverages or chocolate 12 hours prior to your test. . Do not take any antihistamines 12 hours prior to your test.  On the Day of the Test: . Drink  plenty of water. Do not drink any water within one hour of the test. . Do not eat any food 4 hours prior to the test. . You may take your regular medications prior to the test.  . Take metoprolol (Lopressor) two hours prior to test.       After the Test: . Drink plenty of water. . After receiving IV contrast, you may experience a mild flushed feeling. This is normal. . On occasion, you may experience a mild rash up to 24 hours after the test. This is not dangerous. If this occurs, you can take Benadryl 25 mg and increase your fluid intake. . If you experience trouble breathing, this can be serious. If it is severe call 911 IMMEDIATELY. If it is mild, please call our office.  Once we have confirmed authorization from your insurance company, we will call you to set up a date and time for your test. Based on how quickly your insurance processes prior authorizations requests, please allow up to 4 weeks to be contacted for scheduling your Cardiac CT appointment. Be advised that routine Cardiac CT appointments could be scheduled as many as 8 weeks after your provider has ordered it.  For non-scheduling related questions, please contact the cardiac imaging nurse navigator should you have any questions/concerns: Marchia Bond, Cardiac Imaging Nurse Navigator Burley Saver, Interim Cardiac Imaging Nurse [place] [facility] Direct Office Dial: [phone]   For scheduling needs, including cancellations and rescheduling, please call [place], [phone].     Follow-Up: At [facility], you and your health needs are our priority.  As part of our continuing mission to provide you with exceptional heart care, we have created designated Provider Care Teams.  These Care Teams include your primary Cardiologist (physician) and Advanced Practice Providers (APPs -  Physician Assistants and Nurse Practitioners) who all work together to provide you with the care you need, when you  need it.  We recommend signing up for the patient portal called "MyChart".  Sign up information is provided on this After Visit Summary.  MyChart is used to connect with patients for Virtual Visits (Telemedicine).  Patients are able to view lab/test results, encounter notes, upcoming appointments, etc.  Non-urgent messages can be sent to your provider as well.   To learn more about what you can do with MyChart, go to [email or web address].    Your next appointment:   8 week(s)  The format for your next appointment:   In Person  Provider:   Berniece Salines, DO   Other Instructions  Echocardiogram An echocardiogram is a test that uses sound waves (ultrasound) to produce images of the heart. Images from an echocardiogram can provide important information about:  Heart size and shape.  The size and thickness and movement of your heart's walls.  Heart muscle function and strength.  Heart valve function or if you have stenosis. Stenosis is when the heart valves are too narrow.  If blood is flowing backward through the heart valves (regurgitation).  A tumor or infectious growth around the heart valves.  Areas of heart muscle that are not working well because of poor blood flow or injury from a heart attack.  Aneurysm detection. An aneurysm is a weak or damaged part of an artery wall. The wall bulges out from the normal force of blood pumping through the body. Tell a health care provider about:  Any allergies you have.  All medicines you are taking, including vitamins, herbs, eye drops, creams, and over-the-counter medicines.  Any blood disorders you have.  Any surgeries you have had.  Any medical conditions you have.  Whether you are pregnant or may be pregnant. What are the risks? Generally, this is a safe test. However, problems may occur, including an allergic reaction to dye (contrast) that may be used during the test. What happens before the test? No specific  preparation is needed. You may eat and drink normally. What happens during the test?  You will take off your clothes from the waist up and put on a hospital gown.  Electrodes or electrocardiogram (ECG)patches may be placed on your chest. The electrodes or patches are then connected to a device that monitors your heart rate and rhythm.  You will lie down on a table for an ultrasound exam. A gel will be applied to your chest to help sound waves pass through your skin.  A handheld device, called a transducer, will be pressed against your chest and moved over your heart. The transducer produces sound waves that travel to your heart and bounce back (or "echo" back) to the transducer. These sound waves will be captured in real-time and changed into images of your heart that can be viewed on a video monitor. The images will be recorded on a computer and reviewed by your health care provider.  You may be asked to change positions or hold your breath for a short time. This makes it easier to get different views or better views of your heart.  In some cases, you may receive contrast  through an IV in one of your veins. This can improve the quality of the pictures from your heart. The procedure may vary among health care providers and hospitals.   What can I expect after the test? You may return to your normal, everyday life, including diet, activities, and medicines, unless your health care provider tells you not to do that. Follow these instructions at home:  It is up to you to get the results of your test. Ask your health care provider, or the department that is doing the test, when your results will be ready.  Keep all follow-up visits. This is important. Summary  An echocardiogram is a test that uses sound waves (ultrasound) to produce images of the heart.  Images from an echocardiogram can provide important information about the size and shape of your heart, heart muscle function, heart valve  function, and other possible heart problems.  You do not need to do anything to prepare before this test. You may eat and drink normally.  After the echocardiogram is completed, you may return to your normal, everyday life, unless your health care provider tells you not to do that. This information is not intended to replace advice given to you by your health care provider. Make sure you discuss any questions you have with your health care provider. Document Revised: 03/25/2020 Document Reviewed: 03/25/2020 Elsevier Patient Education  2021 [place].  ZIO XT  . Call [employer] with any questions during wear 24/7: [phone]  . Zio patch should be worn for 14 days unless otherwise instructed  . Do not shower for 24 hours after Zio is applied (sponge bath is fine, just keep the patch dry)  . After that, when showering avoid direct shower stream of water onto Zio patch, pat dry around the patch  . Avoid excessive sweating  . Push the button when you are feeling any cardiac symptoms and record them in the logbook or on the "My Zio" App  . Refer to the removal date listed on the front of the symptom logbook and remove Zio patch according to the instructions in the back of the logbook  . Place Zio patch and symptom logbook inside the provided pre-labeled box, tape the box, & place in outgoing [employer]

## 2020-09-23 NOTE — Progress Notes (Signed)
Cardiology Office Note:    Date:  09/23/2020   ID:  Bobby Bailey, DOB January 28, 1976, MRN [id number]  PCP:  Heron Nay, PA  Cardiologist:  Thomasene Ripple, DO  Electrophysiologist:  None   Referring MD: Farrel Gordon, PA-C   " I have been having palpitations and chest pain.    History of Present Illness:    Bobby Bailey is a 45 y.o. male with no medical history comes today to be evaluated for palpitations and chest pain.  The patient tells me that 10 years ago he had similar episodes where he had increasing palpitations and then left-sided chest discomfort.  At that time he had stress test done but he cannot really remember the exact results for this.  He notes that he gets abrupt onset of fast heartbeat which last for about 45 seconds to 2 minutes.  He notes that at times is associated with left-sided chest discomfort and is intermittent.  Shortness of breath is also associated with this.  He does not matter what he is doing when this palpitations happen.  His chest pain he tells me is left-sided now is occurring at least almost every morning.  But was remarkable for the patient is the fact that he has got left-sided leg numbness and arm numbness almost every night.  He is concerned.  He is a former smoker, but still vapes.  Past Medical History:  Diagnosis Date  . Depression   . Kidney stones     Past Surgical History:  Procedure Laterality Date  . APPENDECTOMY      Current Medications: Current Meds  Medication Sig  . buprenorphine (SUBUTEX) 8 MG SUBL SL tablet Place 0.5 tablets under the tongue 3 (three) times daily.  . butalbital-acetaminophen-caffeine (FIORICET) 50-325-40 MG tablet Take by mouth.  . Cyanocobalamin (VITAMIN B-12 PO) Take 1 tablet by mouth daily.  Marland Kitchen VITAMIN D PO Take 1 tablet by mouth daily.  Marland Kitchen VITAMIN E PO Take 1 tablet by mouth daily.     Allergies:   Doxycycline and Wound dressing adhesive   Social History   Socioeconomic History  . Marital status:  Married    Spouse name: Not on file  . Number of children: Not on file  . Years of education: Not on file  . Highest education level: Not on file  Occupational History  . Not on file  Tobacco Use  . Smoking status: Former Smoker    Packs/day: 0.50    Types: Cigarettes  . Smokeless tobacco: Never Used  Substance and Sexual Activity  . Alcohol use: No  . Drug use: No  . Sexual activity: Not on file  Other Topics Concern  . Not on file  Social History Narrative  . Not on file   Social Determinants of Health   Financial Resource Strain: Not on file  Food Insecurity: Not on file  Transportation Needs: Not on file  Physical Activity: Not on file  Stress: Not on file  Social Connections: Not on file     Family History: The patient's family history is not on file.  ROS:   Review of Systems  Constitution: Negative for decreased appetite, fever and weight gain.  HENT: Negative for congestion, ear discharge, hoarse voice and sore throat.   Eyes: Negative for discharge, redness, vision loss in right eye and visual halos.  Cardiovascular: Reports palpitations chest pain, dyspnea on exertion.  Negative for leg swelling, orthopnea. Respiratory: Negative for cough, hemoptysis, shortness of breath and snoring.  Endocrine: Negative for heat intolerance and polyphagia.  Hematologic/Lymphatic: Negative for bleeding problem. Does not bruise/bleed easily.  Skin: Negative for flushing, nail changes, rash and suspicious lesions.  Musculoskeletal: Negative for arthritis, joint pain, muscle cramps, myalgias, neck pain and stiffness.  Gastrointestinal: Negative for abdominal pain, bowel incontinence, diarrhea and excessive appetite.  Genitourinary: Negative for decreased libido, genital sores and incomplete emptying.  Neurological: Negative for brief paralysis, focal weakness, headaches and loss of balance.  Psychiatric/Behavioral: Negative for altered mental status, depression and suicidal  ideas.  Allergic/Immunologic: Negative for HIV exposure and persistent infections.    EKGs/Labs/Other Studies Reviewed:    The following studies were reviewed today:   EKG:  The ekg ordered today demonstrates sinus rhythm, heart rate 73 bpm  Recent Labs: 08/20/2020: BUN 17; Creatinine, Ser 0.85; Hemoglobin 15.6; Platelets 424; Potassium 4.1; Sodium 137  Recent Lipid Panel No results found for: CHOL, TRIG, HDL, CHOLHDL, VLDL, LDLCALC, LDLDIRECT  Physical Exam:    VS:  BP 120/72 (BP Location: Left Arm)   Pulse 73   Ht 6' (1.829 m)   Wt 190 lb (86.2 kg)   SpO2 98%   BMI 25.77 kg/m     Wt Readings from Last 3 Encounters:  09/23/20 190 lb (86.2 kg)  08/20/20 180 lb (81.6 kg)  07/26/19 198 lb (89.8 kg)     GEN: Well nourished, well developed in no acute distress HEENT: Normal NECK: No JVD; No carotid bruits LYMPHATICS: No lymphadenopathy CARDIAC: S1S2 noted,RRR, no murmurs, rubs, gallops RESPIRATORY:  Clear to auscultation without rales, wheezing or rhonchi  ABDOMEN: Soft, non-tender, non-distended, +bowel sounds, no guarding. EXTREMITIES: No edema, No cyanosis, no clubbing MUSCULOSKELETAL:  No deformity  SKIN: Warm and dry NEUROLOGIC:  Alert and oriented x 3, non-focal PSYCHIATRIC:  Normal affect, good insight  ASSESSMENT:    1. Palpitations   2. Chest pain of uncertain etiology   3. Dyspnea on exertion    PLAN:     1.  His chest pain is concerning patient does have intermediate risk for coronary artery disease would not like to do is pursue an ischemic evaluation in this patient.  Shared decision a coronary CTA at this time is appropriate.  I have discussed with the patient about the testing.  The patient has no IV contrast allergy and is agreeable to proceed with this test.  Sublingual nitroglycerin prescription was sent, its protocol and 911 protocol explained and the patient vocalized understanding questions were answered to the patient's satisfaction  I would  like to rule out a cardiovascular etiology of this palpitation, therefore at this time I would like to placed a zio patch for 7 days. In additon a transthoracic echocardiogram will be ordered to assess LV/RV function and any structural abnormalities. Once these testing have been performed amd reviewed further reccomendations will be made. For now, I do reccomend that the patient goes to the nearest ED if  symptoms recur.  BMP mag and TSH will be done today.  The patient is in agreement with the above plan. The patient left the office in stable condition.  The patient will follow up in   Medication Adjustments/Labs and Tests Ordered: Current medicines are reviewed at length with the patient today.  Concerns regarding medicines are outlined above.  No orders of the defined types were placed in this encounter.  No orders of the defined types were placed in this encounter.   There are no Patient Instructions on file for this visit.   Adopting  a Healthy Lifestyle.  Know what a healthy weight is for you (roughly BMI <25) and aim to maintain this   Aim for 7+ servings of fruits and vegetables daily   65-80+ fluid ounces of water or unsweet tea for healthy kidneys   Limit to max 1 drink of alcohol per day; avoid smoking/tobacco   Limit animal fats in diet for cholesterol and heart health - choose grass fed whenever available   Avoid highly processed foods, and foods high in saturated/trans fats   Aim for low stress - take time to unwind and care for your mental health   Aim for 150 min of moderate intensity exercise weekly for heart health, and weights twice weekly for bone health   Aim for 7-9 hours of sleep daily   When it comes to diets, agreement about the perfect plan isnt easy to find, even among the experts. Experts at the [facility] of [employer] developed an idea known as the Healthy Eating Plate. Just imagine a plate divided into logical, healthy portions.   The  emphasis is on diet quality:   Load up on vegetables and fruits - one-half of your plate: Aim for color and variety, and remember that potatoes dont count.   Go for whole grains - one-quarter of your plate: Whole wheat, barley, wheat berries, quinoa, oats, brown rice, and foods made with them. If you want pasta, go with whole wheat pasta.   Protein power - one-quarter of your plate: Fish, chicken, beans, and nuts are all healthy, versatile protein sources. Limit red meat.   The diet, however, does go beyond the plate, offering a few other suggestions.   Use healthy plant oils, such as olive, canola, soy, corn, sunflower and peanut. Check the labels, and avoid partially hydrogenated oil, which have unhealthy trans fats.   If youre thirsty, drink water. Coffee and tea are good in moderation, but skip sugary drinks and limit milk and dairy products to one or two daily servings.   The type of carbohydrate in the diet is more important than the amount. Some sources of carbohydrates, such as vegetables, fruits, whole grains, and beans-are healthier than others.   Finally, stay active  Signed, Thomasene Ripple, DO  09/23/2020 11:13 AM     [facility]

## 2020-09-24 LAB — BASIC METABOLIC PANEL
BUN/Creatinine Ratio: 19 (ref 9–20)
BUN: 16 mg/dL (ref 6–24)
CO2: 24 mmol/L (ref 20–29)
Calcium: 9.7 mg/dL (ref 8.7–10.2)
Chloride: 100 mmol/L (ref 96–106)
Creatinine, Ser: 0.84 mg/dL (ref 0.76–1.27)
GFR calc Af Amer: 124 mL/min/{1.73_m2} (ref >59)
GFR calc non Af Amer: 107 mL/min/{1.73_m2} (ref >59)
Glucose: 80 mg/dL (ref 65–99)
Potassium: 4.9 mmol/L (ref 3.5–5.2)
Sodium: 138 mmol/L (ref 134–144)

## 2020-09-24 LAB — MAGNESIUM: Magnesium: 2.3 mg/dL (ref 1.6–2.3)

## 2020-09-24 LAB — TSH: TSH: 0.757 u[IU]/mL (ref 0.450–4.500)

## 2020-09-25 ENCOUNTER — Telehealth: Payer: Self-pay | Admitting: Cardiology

## 2020-09-25 NOTE — Telephone Encounter (Signed)
Patient is returning call to discuss lab results. 

## 2020-09-25 NOTE — Telephone Encounter (Signed)
Spoke with patient regarding results and recommendation.  Patient verbalizes understanding and is agreeable to plan of care. Advised patient to call back with any issues or concerns.  

## 2020-10-14 ENCOUNTER — Telehealth: Payer: Self-pay

## 2020-10-14 NOTE — Telephone Encounter (Signed)
left a message to return my call.

## 2020-10-14 NOTE — Telephone Encounter (Signed)
-----   Message from Thomasene Ripple, DO sent at 10/13/2020  1:41 PM EST ----- Normal study

## 2020-10-20 ENCOUNTER — Telehealth (HOSPITAL_COMMUNITY): Payer: Self-pay | Admitting: *Deleted

## 2020-10-20 NOTE — Telephone Encounter (Signed)
Reaching out to patient to offer assistance regarding upcoming cardiac imaging study; pt verbalizes understanding of appt date/time, parking situation and where to check in, pre-test NPO status and medications ordered, and verified current allergies; name and call back number provided for further questions should they arise  Merle Prescott RN Navigator Cardiac Imaging [facility] [phone] office [phone] cell  

## 2020-10-21 ENCOUNTER — Ambulatory Visit (HOSPITAL_COMMUNITY)
Admission: RE | Admit: 2020-10-21 | Discharge: 2020-10-21 | Disposition: A | Discharge status: Home / Self Care | Payer: No Typology Code available for payment source | Source: Ambulatory Visit | Attending: Cardiology | Admitting: Cardiology

## 2020-10-21 ENCOUNTER — Encounter (HOSPITAL_COMMUNITY): Payer: Self-pay

## 2020-10-21 ENCOUNTER — Other Ambulatory Visit: Payer: Self-pay

## 2020-10-21 DIAGNOSIS — Z006 Encounter for examination for normal comparison and control in clinical research program: Secondary | ICD-10-CM

## 2020-10-21 DIAGNOSIS — R079 Chest pain, unspecified: Secondary | ICD-10-CM

## 2020-10-21 MED ORDER — IOHEXOL 350 MG/ML SOLN
80.0000 mL | Freq: Once | INTRAVENOUS | Status: AC | PRN
  Administered 2020-10-21: 80 mL via INTRAVENOUS

## 2020-10-21 MED ORDER — NITROGLYCERIN 0.4 MG SL SUBL
SUBLINGUAL_TABLET | SUBLINGUAL | Status: AC
  Filled 2020-10-21: qty 2

## 2020-10-21 MED ORDER — NITROGLYCERIN 0.4 MG SL SUBL
0.8000 mg | SUBLINGUAL_TABLET | Freq: Once | SUBLINGUAL | Status: AC | PRN
  Administered 2020-10-21: 0.8 mg via SUBLINGUAL

## 2020-10-21 NOTE — Research (Signed)
IDENTIFY Informed Consent                  Subject Name: Bobby Bailey    Subject met inclusion and exclusion criteria.  The informed consent form, study requirements and expectations were reviewed with the subject and questions and concerns were addressed prior to the signing of the consent form.  The subject verbalized understanding of the trial requirements.  The subject agreed to participate in the IDENTIFY trial and signed the informed consent at 10:42PM on 10/21/20.  The informed consent was obtained prior to performance of any protocol-specific procedures for the subject.  A copy of the signed informed consent was given to the subject and a copy was placed in the subject's medical record.   Meade Maw, [employer] architect

## 2020-10-22 ENCOUNTER — Telehealth: Payer: Self-pay

## 2020-10-22 NOTE — Telephone Encounter (Signed)
Left message on patients voicemail to please return our call.   

## 2020-10-22 NOTE — Telephone Encounter (Signed)
-----   Message from Thomasene Ripple, DO sent at 10/22/2020 11:29 AM EST ----- Great news, coronary CTA normal.

## 2020-10-23 ENCOUNTER — Telehealth: Payer: Self-pay

## 2020-10-23 NOTE — Telephone Encounter (Signed)
-----   Message from Thomasene Ripple, DO sent at 10/22/2020 11:29 AM EST ----- Great news, coronary CTA normal.

## 2020-10-23 NOTE — Telephone Encounter (Signed)
Left message on patients voicemail to please return our call.   

## 2020-10-24 ENCOUNTER — Ambulatory Visit (HOSPITAL_BASED_OUTPATIENT_CLINIC_OR_DEPARTMENT_OTHER): Payer: No Typology Code available for payment source

## 2020-10-30 ENCOUNTER — Telehealth: Payer: Self-pay

## 2020-10-30 NOTE — Telephone Encounter (Signed)
Left message on patients voicemail to please return our call.   I will send the patient a letter at this time as well after trying to reach him x4 with no success.

## 2020-10-30 NOTE — Telephone Encounter (Signed)
-----   Message from Felecia Jan, RN sent at 10/24/2020  8:37 AM EST -----  ----- Message ----- From: Thomasene Ripple, DO Sent: 10/22/2020  11:29 AM EST To: [facility] Triage  Great news, coronary CTA normal.

## 2020-11-25 ENCOUNTER — Ambulatory Visit (HOSPITAL_COMMUNITY): Payer: No Typology Code available for payment source

## 2020-12-02 ENCOUNTER — Ambulatory Visit: Payer: No Typology Code available for payment source | Admitting: Cardiology

## 2021-03-09 ENCOUNTER — Telehealth: Payer: Self-pay

## 2021-03-09 DIAGNOSIS — Z006 Encounter for examination for normal comparison and control in clinical research program: Secondary | ICD-10-CM

## 2021-03-09 NOTE — Telephone Encounter (Signed)
I have attempted without success to contact this patient by phone for his Identify 90 day follow up phone call. I left a message for patient to return my phone call with my name and callback number. An e-mail was also sent to patient.
